# Patient Record
Sex: Male | Born: 1960 | Race: Black or African American | Hispanic: No | Marital: Married | Smoking: Never smoker
Health system: Southern US, Community
[De-identification: ages and names within clinical notes are randomized; demographics above are authoritative.]

## PROBLEM LIST (undated history)

## (undated) ENCOUNTER — Emergency Department (HOSPITAL_COMMUNITY): Admission: EM | Discharge status: Absent without leave | Payer: BC Managed Care – PPO

## (undated) DIAGNOSIS — Z9889 Other specified postprocedural states: Secondary | ICD-10-CM

## (undated) DIAGNOSIS — Z87820 Personal history of traumatic brain injury: Secondary | ICD-10-CM

## (undated) DIAGNOSIS — E291 Testicular hypofunction: Secondary | ICD-10-CM

## (undated) DIAGNOSIS — E538 Deficiency of other specified B group vitamins: Secondary | ICD-10-CM

## (undated) DIAGNOSIS — C61 Malignant neoplasm of prostate: Secondary | ICD-10-CM

## (undated) DIAGNOSIS — R972 Elevated prostate specific antigen [PSA]: Secondary | ICD-10-CM

## (undated) DIAGNOSIS — G609 Hereditary and idiopathic neuropathy, unspecified: Secondary | ICD-10-CM

## (undated) DIAGNOSIS — Z9189 Other specified personal risk factors, not elsewhere classified: Secondary | ICD-10-CM

## (undated) DIAGNOSIS — R112 Nausea with vomiting, unspecified: Secondary | ICD-10-CM

## (undated) DIAGNOSIS — E559 Vitamin D deficiency, unspecified: Secondary | ICD-10-CM

## (undated) DIAGNOSIS — E785 Hyperlipidemia, unspecified: Secondary | ICD-10-CM

## (undated) HISTORY — PX: PROSTATE BIOPSY: SHX241

## (undated) HISTORY — PX: LOWER LEG SOFT TISSUE TUMOR EXCISION: SUR553

## (undated) HISTORY — PX: SKIN LESION EXCISION: SHX2412

## (undated) HISTORY — PX: COLONOSCOPY WITH PROPOFOL: SHX5780

## (undated) HISTORY — DX: Hyperlipidemia, unspecified: E78.5

---

## 2012-05-28 HISTORY — PX: COLONOSCOPY WITH PROPOFOL: SHX5780

## 2013-02-19 ENCOUNTER — Ambulatory Visit (INDEPENDENT_AMBULATORY_CARE_PROVIDER_SITE_OTHER): Payer: BC Managed Care – PPO

## 2013-02-19 ENCOUNTER — Encounter: Payer: Self-pay | Admitting: Family Medicine

## 2013-02-19 ENCOUNTER — Ambulatory Visit (INDEPENDENT_AMBULATORY_CARE_PROVIDER_SITE_OTHER): Payer: BC Managed Care – PPO | Admitting: Family Medicine

## 2013-02-19 VITALS — BP 138/77 | HR 87 | Temp 98.6°F | Ht 72.0 in | Wt 310.0 lb

## 2013-02-19 DIAGNOSIS — R05 Cough: Secondary | ICD-10-CM

## 2013-02-19 DIAGNOSIS — R5381 Other malaise: Secondary | ICD-10-CM

## 2013-02-19 DIAGNOSIS — G2581 Restless legs syndrome: Secondary | ICD-10-CM

## 2013-02-19 DIAGNOSIS — Z0000 Encounter for general adult medical examination without abnormal findings: Secondary | ICD-10-CM

## 2013-02-19 DIAGNOSIS — R109 Unspecified abdominal pain: Secondary | ICD-10-CM

## 2013-02-19 DIAGNOSIS — R0609 Other forms of dyspnea: Secondary | ICD-10-CM

## 2013-02-19 DIAGNOSIS — R0683 Snoring: Secondary | ICD-10-CM

## 2013-02-19 DIAGNOSIS — R809 Proteinuria, unspecified: Secondary | ICD-10-CM

## 2013-02-19 LAB — POCT URINALYSIS DIPSTICK
Bilirubin, UA: 1.025
Glucose, UA: 6
Ketones, UA: NEGATIVE
Leukocytes, UA: NEGATIVE
Nitrite, UA: NEGATIVE
Spec Grav, UA: 1.025
Urobilinogen, UA: NEGATIVE
pH, UA: 6

## 2013-02-19 LAB — POCT UA - MICROSCOPIC ONLY
Crystals, Ur, HPF, POC: NEGATIVE
Epithelial cells, urine per micros: NEGATIVE
Yeast, UA: NEGATIVE

## 2013-02-19 MED ORDER — CARBIDOPA-LEVODOPA ER 50-200 MG PO TBCR: 1.0000 | EXTENDED_RELEASE_TABLET | Freq: Two times a day (BID) | ORAL | Status: DC

## 2013-02-19 NOTE — Patient Instructions (Signed)

## 2013-02-19 NOTE — Progress Notes (Signed)
Subjective:    Patient ID: Scott David, male    DOB: Sep 27, 1960, 52 y.o.   MRN: [id number]  HPI This 52 y.o. male presents for evaluation of cpe.  He is having some fatigue.  He is accompanied By his wife.  He has been having somnolence.  He is working 10 - 14 hour days.  He has not had a CPE in 2 years.  He has hx of obesity.  He has lower abdominal pain in his groin area.  He has family hx of cancer.  He is concerned about having cancer.  His brother has hx of prostate cancer.  His Sister has hx of colon cancer.  His father had kidney cancer but it started in his prostate.  He  Has snoring and his wife states he quits breathing at night. He c/o cough   Review of Systems C/o insomnia, fatigue, abdominal pain, cough,and somnolence.    No chest pain, SOB, HA, dizziness, vision change, N/V, diarrhea, constipation, dysuria, urinary urgency or frequency, myalgias, arthralgias or rash.  Objective:   Physical Exam Vital signs noted  Well developed well nourished obese AA male.  HEENT - Head atraumatic Normocephalic                Eyes - PERRLA, Conjuctiva - clear Sclera- Clear EOMI                Ears - EAC's Wnl TM's Wnl Gross Hearing WNL                Nose - Nares patent                 Throat - oropharanx wnl Respiratory - Lungs CTA bilateral Cardiac - RRR S1 and S2 without murmur GI - Abdomen soft Nontender and bowel sounds active x 4 Rectal - Deferred Extremities - No edema. Neuro - Grossly intact.  Results for orders placed in visit on 02/19/13  POCT UA - MICROSCOPIC ONLY      Result Value Range   WBC, Ur, HPF, POC occ     RBC, urine, microscopic 3-10     Bacteria, U Microscopic few     Mucus, UA mod     Epithelial cells, urine per micros neg     Crystals, Ur, HPF, POC neg     Casts, Ur, LPF, POC occ     Yeast, UA neg    POCT URINALYSIS DIPSTICK      Result Value Range   Color, UA amber     Clarity, UA clear     Glucose, UA 6.0     Bilirubin, UA 1.025     Ketones,  UA negative     Spec Grav, UA 1.025     Blood, UA moderate     pH, UA 6.0     Protein, UA 4+     Urobilinogen, UA negative     Nitrite, UA negative     Leukocytes, UA Negative        EKG - NSR without acute ST-T changes.  CXR - Normal  Prelimnary reading by Scott Noa Tilla Wilborn,FNP  KUB - Some stool in ascending colon but no acute disease otherwise normal Prelimnary reading by Scott Noa Eward Rutigliano,FNP Assessment & Plan:  Routine general medical examination at a health care facility - Plan: POCT CBC, Lipid panel, CMP14+EGFR, Testosterone,Free and Total, Thyroid Panel With TSH, Vitamin B12, Vit D  25 hydroxy (rtn osteoporosis monitoring), POCT UA - Microscopic Only, POCT urinalysis dipstick  Other malaise and fatigue - Plan: POCT CBC, CMP14+EGFR, Vitamin B12, Vit D  25 hydroxy (rtn osteoporosis monitoring), EKG 12-.Lead  Snoring - Discussed with patient he should get sleep study and he wants to wait.  Obesity - Discussed doing aggressive weight loss measures and start exercising.  RLS (restless legs syndrome) - Plan: carbidopa-levodopa (SINEMET CR) 50-200 MG per tablet  Abdominal pain, unspecified site - Plan: DG Abd 1 View  Cough - Plan: DG Chest 2 View  Proteinuria - Plan: Protein, urine, 24 hour, Creatinine, Urine, 24 Hour.  Follow up next week to discuss lab results.  Deatra Canter FNP

## 2013-02-20 ENCOUNTER — Other Ambulatory Visit (INDEPENDENT_AMBULATORY_CARE_PROVIDER_SITE_OTHER): Payer: BC Managed Care – PPO

## 2013-02-20 DIAGNOSIS — R5381 Other malaise: Secondary | ICD-10-CM

## 2013-02-20 DIAGNOSIS — Z0000 Encounter for general adult medical examination without abnormal findings: Secondary | ICD-10-CM

## 2013-02-20 LAB — POCT CBC
Granulocyte percent: 64 %G (ref 37–80)
HCT, POC: 39.5 % — AB (ref 43.5–53.7)
Hemoglobin: 12.8 g/dL — AB (ref 14.1–18.1)
Lymph, poc: 2.5 (ref 0.6–3.4)
MCH, POC: 27.4 pg (ref 27–31.2)
MCHC: 32.5 g/dL (ref 31.8–35.4)
MCV: 84.4 fL (ref 80–97)
MPV: 6.7 fL (ref 0–99.8)
POC Granulocyte: 5.1 (ref 2–6.9)
POC LYMPH PERCENT: 31.4 %L (ref 10–50)
Platelet Count, POC: 400 10*3/uL (ref 142–424)
RBC: 4.7 M/uL (ref 4.69–6.13)
RDW, POC: 12.7 %
WBC: 8 10*3/uL (ref 4.6–10.2)

## 2013-02-20 NOTE — Progress Notes (Signed)
Patient came in for labs only.

## 2013-02-21 LAB — CMP14+EGFR
ALT: 19 IU/L (ref 0–44)
AST: 20 IU/L (ref 0–40)
Albumin/Globulin Ratio: 1.9 (ref 1.1–2.5)
Albumin: 4.5 g/dL (ref 3.5–5.5)
Alkaline Phosphatase: 57 IU/L (ref 39–117)
BUN/Creatinine Ratio: 12 (ref 9–20)
BUN: 14 mg/dL (ref 6–24)
CO2: 25 mmol/L (ref 18–29)
Calcium: 10 mg/dL (ref 8.7–10.2)
Chloride: 101 mmol/L (ref 97–108)
Creatinine, Ser: 1.19 mg/dL (ref 0.76–1.27)
GFR calc Af Amer: 81 mL/min/{1.73_m2} (ref >59)
GFR calc non Af Amer: 70 mL/min/{1.73_m2} (ref >59)
Globulin, Total: 2.4 g/dL (ref 1.5–4.5)
Glucose: 82 mg/dL (ref 65–99)
Potassium: 4.7 mmol/L (ref 3.5–5.2)
Sodium: 141 mmol/L (ref 134–144)
Total Bilirubin: 0.4 mg/dL (ref 0.0–1.2)
Total Protein: 6.9 g/dL (ref 6.0–8.5)

## 2013-02-21 LAB — LIPID PANEL
Chol/HDL Ratio: 5.1 ratio units — ABNORMAL HIGH (ref 0.0–5.0)
Cholesterol, Total: 198 mg/dL (ref 100–199)
HDL: 39 mg/dL — ABNORMAL LOW (ref >39)
LDL Calculated: 118 mg/dL — ABNORMAL HIGH (ref 0–99)
Triglycerides: 203 mg/dL — ABNORMAL HIGH (ref 0–149)
VLDL Cholesterol Cal: 41 mg/dL — ABNORMAL HIGH (ref 5–40)

## 2013-02-21 LAB — THYROID PANEL WITH TSH
Free Thyroxine Index: 3 (ref 1.2–4.9)
T3 Uptake Ratio: 32 % (ref 24–39)
T4, Total: 9.3 ug/dL (ref 4.5–12.0)
TSH: 3.79 u[IU]/mL (ref 0.450–4.500)

## 2013-02-21 LAB — TESTOSTERONE,FREE AND TOTAL
Testosterone, Free: 4.9 pg/mL — ABNORMAL LOW (ref 7.2–24.0)
Testosterone: 149 ng/dL — ABNORMAL LOW (ref 348–1197)

## 2013-02-21 LAB — VITAMIN B12: Vitamin B-12: 289 pg/mL (ref 211–946)

## 2013-02-21 LAB — VITAMIN D 25 HYDROXY (VIT D DEFICIENCY, FRACTURES): Vit D, 25-Hydroxy: 22.1 ng/mL — ABNORMAL LOW (ref 30.0–100.0)

## 2013-02-23 ENCOUNTER — Other Ambulatory Visit: Payer: BC Managed Care – PPO

## 2013-02-23 NOTE — Addendum Note (Signed)
Addended by: Tommas Olp on: 02/23/2013 11:48 AM   Modules accepted: Orders

## 2013-02-24 LAB — PROTEIN, URINE, 24 HOUR
Protein, 24H Urine: 92 mg/24 hr (ref 30.0–150.0)
Protein, Ur: 9.2 mg/dL (ref 0.0–15.0)

## 2013-02-24 LAB — CREATININE, URINE, 24 HOUR
Creatinine, 24H Ur: 2492 mg/24 hr — ABNORMAL HIGH (ref 1000.0–2000.0)
Creatinine, Ur: 249.2 mg/dL (ref 22.0–328.0)

## 2013-02-27 ENCOUNTER — Other Ambulatory Visit: Payer: Self-pay | Admitting: Family Medicine

## 2013-02-27 LAB — SPECIMEN STATUS REPORT

## 2013-02-27 LAB — PSA: PSA: 2.7 ng/mL (ref 0.0–4.0)

## 2013-03-04 ENCOUNTER — Encounter: Payer: Self-pay | Admitting: Family Medicine

## 2013-03-04 ENCOUNTER — Ambulatory Visit (INDEPENDENT_AMBULATORY_CARE_PROVIDER_SITE_OTHER): Payer: BC Managed Care – PPO | Admitting: Family Medicine

## 2013-03-04 VITALS — BP 125/71 | HR 87 | Temp 97.8°F | Wt 308.4 lb

## 2013-03-04 DIAGNOSIS — R972 Elevated prostate specific antigen [PSA]: Secondary | ICD-10-CM

## 2013-03-04 DIAGNOSIS — R809 Proteinuria, unspecified: Secondary | ICD-10-CM

## 2013-03-04 DIAGNOSIS — E559 Vitamin D deficiency, unspecified: Secondary | ICD-10-CM

## 2013-03-04 DIAGNOSIS — E538 Deficiency of other specified B group vitamins: Secondary | ICD-10-CM

## 2013-03-04 MED ORDER — CYANOCOBALAMIN 1000 MCG/ML IJ SOLN: INTRAMUSCULAR | Status: DC

## 2013-03-04 MED ORDER — VITAMIN D (ERGOCALCIFEROL) 1.25 MG (50000 UNIT) PO CAPS: 50000.0000 [IU] | ORAL_CAPSULE | ORAL | Status: DC

## 2013-03-04 MED ORDER — LISINOPRIL 5 MG PO TABS: 5.0000 mg | ORAL_TABLET | Freq: Every day | ORAL | Status: DC

## 2013-03-04 NOTE — Progress Notes (Signed)
  Subjective:    Patient ID: Scott David, male    DOB: [birth year], 52 y.o.   MRN: [id number]  HPI This 52 y.o. male presents for evaluation of follow up. He has been experiencing fatigue. He has had labs which show low normal vitamin B12.  He has elevated PSA.  He has  Family hx of prostate cancer.  He has low vitamin D.  He has protienuria.  His 24 hour UA shows normal range protienuria and elevated urine creatinine.  His serum creatinine And GFR are wnl.  .   Review of Systems    No chest pain, SOB, HA, dizziness, vision change, N/V, diarrhea, constipation, dysuria, urinary urgency or frequency, myalgias, arthralgias or rash.  Objective:   Physical Exam  Vital signs noted  Well developed well nourished male.  HEENT - Head atraumatic Normocephalic                Eyes - PERRLA, Conjuctiva - clear Sclera- Clear EOMI                Ears - EAC's Wnl TM's Wnl Gross Hearing WNL                Nose - Nares patent                 Throat - oropharanx wnl Respiratory - Lungs CTA bilateral Cardiac - RRR S1 and S2 without murmur GI - Abdomen soft Nontender and bowel sounds active x 4 Rectal - deferred. Extremities - No edema. Neuro - Grossly intact.      Assessment & Plan:  Elevated PSA - Plan: Ambulatory referral to Urology  B12 deficiency - Plan: cyanocobalamin (,VITAMIN B-12,) 1000 MCG/ML injection  Unspecified vitamin D deficiency - Plan: Vitamin D, Ergocalciferol, (DRISDOL) 50000 UNITS CAPS capsule  Proteinuria - Plan: lisinopril (PRINIVIL,ZESTRIL) 5 MG tablet and repeat bmp in 2-4 weeks.  Repeat urine in 3 months at follow up.  Follow up in 3 months  Deatra Canter FNP

## 2013-03-05 NOTE — Patient Instructions (Signed)
Calorie Counting Diet A calorie counting diet requires you to eat the number of calories that are right for you in a day. Calories are the measurement of how much energy you get from the food you eat. Eating the right amount of calories is important for staying at a healthy weight. If you eat too many calories, your body will store them as fat and you may gain weight. If you eat too few calories, you may lose weight. Counting the number of calories you eat during a day will help you know if you are eating the right amount. A Registered Dietitian can determine how many calories you need in a day. The amount of calories needed varies from person to person. If your goal is to lose weight, you will need to eat fewer calories. Losing weight can benefit you if you are overweight or have health problems such as heart disease, high blood pressure, or diabetes. If your goal is to gain weight, you will need to eat more calories. Gaining weight may be necessary if you have a certain health problem that causes your body to need more energy. TIPS Whether you are increasing or decreasing the number of calories you eat during a day, it may be hard to get used to changes in what you eat and drink. The following are tips to help you keep track of the number of calories you eat.  Measure foods at home with measuring cups. This helps you know the amount of food and number of calories you are eating.  Restaurants often serve food in amounts that are larger than 1 serving. While eating out, estimate how many servings of a food you are given. For example, a serving of cooked rice is  cup or about the size of half of a fist. Knowing serving sizes will help you be aware of how much food you are eating at restaurants.  Ask for smaller portion sizes or child-size portions at restaurants.  Plan to eat half of a meal at a restaurant. Take the rest home or share the other half with a friend.  Read the Nutrition Facts panel on  food labels for calorie content and serving size. You can find out how many servings are in a package, the size of a serving, and the number of calories each serving has.  For example, a package might contain 3 cookies. The Nutrition Facts panel on that package says that 1 serving is 1 cookie. Below that, it will say there are 3 servings in the container. The calories section of the Nutrition Facts label says there are 90 calories. This means there are 90 calories in 1 cookie (1 serving). If you eat 1 cookie you have eaten 90 calories. If you eat all 3 cookies, you have eaten 270 calories (3 servings x 90 calories = 270 calories). The list below tells you how big or small some common portion sizes are.  1 oz.........4 stacked dice.  3 oz.........Deck of cards.  1 tsp........Tip of little finger.  1 tbs........Thumb.  2 tbs........Golf ball.   cup.......Half of a fist.  1 cup........A fist. KEEP A FOOD LOG Write down every food item you eat, the amount you eat, and the number of calories in each food you eat during the day. At the end of the day, you can add up the total number of calories you have eaten. It may help to keep a list like the one below. Find out the calorie information by reading the   Nutrition Facts panel on food labels. Breakfast  Bran cereal (1 cup, 110 calories).  Fat-free milk ( cup, 45 calories). Snack  Apple (1 medium, 80 calories). Lunch  Spinach (1 cup, 20 calories).  Tomato ( medium, 20 calories).  Chicken breast strips (3 oz, 165 calories).  Shredded cheddar cheese ( cup, 110 calories).  Light Italian dressing (2 tbs, 60 calories).  Whole-wheat bread (1 slice, 80 calories).  Tub margarine (1 tsp, 35 calories).  Vegetable soup (1 cup, 160 calories). Dinner  Pork chop (3 oz, 190 calories).  Brown rice (1 cup, 215 calories).  Steamed broccoli ( cup, 20 calories).  Strawberries (1  cup, 65 calories).  Whipped cream (1 tbs, 50  calories). Daily Calorie Total: 1425 Document Released: 05/14/2005 Document Revised: 08/06/2011 Document Reviewed: 11/08/2006 [employer] Patient Information 2014 [employer].  

## 2013-06-04 ENCOUNTER — Ambulatory Visit (INDEPENDENT_AMBULATORY_CARE_PROVIDER_SITE_OTHER): Payer: BC Managed Care – PPO | Admitting: Family Medicine

## 2013-06-04 VITALS — BP 132/77 | HR 73 | Temp 98.7°F | Ht 72.0 in | Wt 312.0 lb

## 2013-06-04 DIAGNOSIS — E559 Vitamin D deficiency, unspecified: Secondary | ICD-10-CM

## 2013-06-04 DIAGNOSIS — R809 Proteinuria, unspecified: Secondary | ICD-10-CM

## 2013-06-04 DIAGNOSIS — D649 Anemia, unspecified: Secondary | ICD-10-CM

## 2013-06-04 DIAGNOSIS — E538 Deficiency of other specified B group vitamins: Secondary | ICD-10-CM

## 2013-06-04 LAB — POCT UA - MICROALBUMIN: Microalbumin Ur, POC: 50 mg/L

## 2013-06-04 LAB — POCT CBC
Granulocyte percent: 65.9 %G (ref 37–80)
HCT, POC: 41.3 % — AB (ref 43.5–53.7)
Hemoglobin: 13 g/dL — AB (ref 14.1–18.1)
Lymph, poc: 2.6 (ref 0.6–3.4)
MCH, POC: 26.9 pg — AB (ref 27–31.2)
MCHC: 31.6 g/dL — AB (ref 31.8–35.4)
MCV: 85.2 fL (ref 80–97)
MPV: 6.9 fL (ref 0–99.8)
POC Granulocyte: 5.9 (ref 2–6.9)
POC LYMPH PERCENT: 29.7 %L (ref 10–50)
Platelet Count, POC: 348 10*3/uL (ref 142–424)
RBC: 4.8 M/uL (ref 4.69–6.13)
RDW, POC: 12.3 %
WBC: 8.9 10*3/uL (ref 4.6–10.2)

## 2013-06-04 MED ORDER — CYANOCOBALAMIN 1000 MCG/ML IJ SOLN: INTRAMUSCULAR | Status: DC

## 2013-06-04 NOTE — Progress Notes (Signed)
   Subjective:    Patient ID: Scott David, male    DOB: 14-Jul-1960, 53 y.o.   MRN: [id number]  HPI  This 53 y.o. male presents for evaluation of follow up from last appointment and on labs.  He has protienuria and was started on lisinopril.  He has no diabetes.  He has anemia.  He has low b12. He has elevated PSA and has been referred to urology where he is pending a biopsy.  He is hypogonadal.  He has been getting b12 shots by his wife monthly.  He also had low vitamin D.  Review of Systems No chest pain, SOB, HA, dizziness, vision change, N/V, diarrhea, constipation, dysuria, urinary urgency or frequency, myalgias, arthralgias or rash.     Objective:   Physical Exam  Vital signs noted  Well developed well nourished male.  HEENT - Head atraumatic Normocephalic                Eyes - PERRLA, Conjuctiva - clear Sclera- Clear EOMI                Ears - EAC's Wnl TM's Wnl Gross Hearing WNL                Nose - Nares patent                 Throat - oropharanx wnl Respiratory - Lungs CTA bilateral Cardiac - RRR S1 and S2 without murmur GI - Abdomen soft Nontender and bowel sounds active x 4 Extremities - No edema. Neuro - Grossly intact.      Assessment & Plan:  B12 deficiency - Plan: cyanocobalamin (,VITAMIN B-12,) 1000 MCG/ML injection  Anemia - Plan: POCT CBC, Iron  Unspecified vitamin D deficiency - Plan: Vit D  25 hydroxy (rtn osteoporosis monitoring)  Proteinuria - Plan: POCT UA - Microalbumin, Microalbumin, urine  Hypogonadism - Wait until done with urology and when clear can start replacement.  Elevated PSA - Follow up with Urology.  Follow up in 3 - 4 months.  Lysbeth Penner FNP

## 2013-06-04 NOTE — Patient Instructions (Signed)

## 2013-06-05 LAB — VITAMIN D 25 HYDROXY (VIT D DEFICIENCY, FRACTURES): Vit D, 25-Hydroxy: 20.4 ng/mL — ABNORMAL LOW (ref 30.0–100.0)

## 2013-06-05 LAB — MICROALBUMIN, URINE: Microalbumin, Urine: 70.4 ug/mL — ABNORMAL HIGH (ref 0.0–17.0)

## 2013-06-05 LAB — IRON: Iron: 77 ug/dL (ref 40–155)

## 2013-06-09 ENCOUNTER — Other Ambulatory Visit: Payer: Self-pay | Admitting: Family Medicine

## 2013-06-09 DIAGNOSIS — E559 Vitamin D deficiency, unspecified: Secondary | ICD-10-CM

## 2013-06-09 MED ORDER — VITAMIN D (ERGOCALCIFEROL) 1.25 MG (50000 UNIT) PO CAPS: 50000.0000 [IU] | ORAL_CAPSULE | ORAL | Status: DC

## 2013-06-09 MED ORDER — LISINOPRIL 10 MG PO TABS: 10.0000 mg | ORAL_TABLET | Freq: Every day | ORAL | Status: DC

## 2013-06-25 ENCOUNTER — Other Ambulatory Visit: Payer: Self-pay | Admitting: Urology

## 2013-07-20 ENCOUNTER — Encounter (HOSPITAL_BASED_OUTPATIENT_CLINIC_OR_DEPARTMENT_OTHER): Payer: Self-pay | Admitting: *Deleted

## 2013-08-10 ENCOUNTER — Encounter (HOSPITAL_BASED_OUTPATIENT_CLINIC_OR_DEPARTMENT_OTHER): Payer: Self-pay | Admitting: *Deleted

## 2013-08-10 NOTE — Progress Notes (Signed)
SPOKE W/ WIFE. NPO AFTER MN. ARRIVE AT 0700. NEEDS HG. WILL DO FLEET ENEMA AM DOS.

## 2013-08-10 NOTE — Progress Notes (Signed)
08/10/13 1201  OBSTRUCTIVE SLEEP APNEA  Have you ever been diagnosed with sleep apnea through a sleep study? No  Do you snore loudly (loud enough to be heard through closed doors)?  0  Do you often feel tired, fatigued, or sleepy during the daytime? 0  Has anyone observed you stop breathing during your sleep? 0  Do you have, or are you being treated for high blood pressure? 0  BMI more than 35 kg/m2? 1  Age over 52 years old? 1  Neck circumference greater than 40 cm/18 inches? 1  Gender: 1  Obstructive Sleep Apnea Score 4  Score 4 or greater  Results sent to PCP

## 2013-08-11 ENCOUNTER — Other Ambulatory Visit: Payer: Self-pay | Admitting: *Deleted

## 2013-08-11 DIAGNOSIS — G4733 Obstructive sleep apnea (adult) (pediatric): Secondary | ICD-10-CM

## 2013-08-14 ENCOUNTER — Ambulatory Visit (HOSPITAL_BASED_OUTPATIENT_CLINIC_OR_DEPARTMENT_OTHER): Admission: RE | Admit: 2013-08-14 | Payer: BC Managed Care – PPO | Source: Ambulatory Visit | Admitting: Urology

## 2013-08-14 ENCOUNTER — Encounter (HOSPITAL_BASED_OUTPATIENT_CLINIC_OR_DEPARTMENT_OTHER): Payer: Self-pay | Admitting: Anesthesiology

## 2013-08-14 HISTORY — DX: Nausea with vomiting, unspecified: Z98.890

## 2013-08-14 HISTORY — DX: Personal history of traumatic brain injury: Z87.820

## 2013-08-14 HISTORY — DX: Other specified personal risk factors, not elsewhere classified: Z91.89

## 2013-08-14 HISTORY — DX: Elevated prostate specific antigen (PSA): R97.20

## 2013-08-14 HISTORY — DX: Vitamin D deficiency, unspecified: E55.9

## 2013-08-14 HISTORY — DX: Testicular hypofunction: E29.1

## 2013-08-14 HISTORY — DX: Deficiency of other specified B group vitamins: E53.8

## 2013-08-14 HISTORY — DX: Hereditary and idiopathic neuropathy, unspecified: G60.9

## 2013-08-14 HISTORY — DX: Nausea with vomiting, unspecified: R11.2

## 2013-08-14 SURGERY — BIOPSY, PROSTATE, RECTAL APPROACH, WITH US GUIDANCE
Anesthesia: General

## 2013-08-14 NOTE — Anesthesia Preprocedure Evaluation (Deleted)
Anesthesia Evaluation  Patient identified by MRN, date of birth, ID band Patient awake    Reviewed: Allergy & Precautions, H&P , NPO status , Patient's Chart, lab work & pertinent test results  Airway Mallampati: II TM Distance: <3 FB Neck ROM: Full    Dental no notable dental hx.    Pulmonary neg pulmonary ROS,  breath sounds clear to auscultation  Pulmonary exam normal + decreased breath sounds      Cardiovascular negative cardio ROS  Rhythm:Regular Rate:Normal     Neuro/Psych negative neurological ROS  negative psych ROS   GI/Hepatic negative GI ROS, Neg liver ROS,   Endo/Other  Morbid obesity  Renal/GU negative Renal ROS  negative genitourinary   Musculoskeletal negative musculoskeletal ROS (+)   Abdominal   Peds negative pediatric ROS (+)  Hematology negative hematology ROS (+)   Anesthesia Other Findings   Reproductive/Obstetrics negative OB ROS                           Anesthesia Physical Anesthesia Plan  ASA: III  Anesthesia Plan: MAC   Post-op Pain Management:    Induction: Intravenous  Airway Management Planned: Simple Face Mask  Additional Equipment:   Intra-op Plan:   Post-operative Plan:   Informed Consent: I have reviewed the patients History and Physical, chart, labs and discussed the procedure including the risks, benefits and alternatives for the proposed anesthesia with the patient or authorized representative who has indicated his/her understanding and acceptance.   Dental advisory given  Plan Discussed with: CRNA and Surgeon  Anesthesia Plan Comments:         Anesthesia Quick Evaluation

## 2013-10-02 ENCOUNTER — Encounter: Payer: Self-pay | Admitting: Family Medicine

## 2013-10-02 ENCOUNTER — Ambulatory Visit (INDEPENDENT_AMBULATORY_CARE_PROVIDER_SITE_OTHER): Payer: BC Managed Care – PPO | Admitting: Family Medicine

## 2013-10-02 VITALS — BP 128/65 | HR 85 | Temp 98.3°F | Ht 72.0 in | Wt 316.8 lb

## 2013-10-02 DIAGNOSIS — E559 Vitamin D deficiency, unspecified: Secondary | ICD-10-CM

## 2013-10-02 DIAGNOSIS — R5381 Other malaise: Secondary | ICD-10-CM

## 2013-10-02 DIAGNOSIS — E538 Deficiency of other specified B group vitamins: Secondary | ICD-10-CM

## 2013-10-02 DIAGNOSIS — R809 Proteinuria, unspecified: Secondary | ICD-10-CM

## 2013-10-02 DIAGNOSIS — R5383 Other fatigue: Secondary | ICD-10-CM

## 2013-10-02 LAB — POCT CBC
Granulocyte percent: 63 %G (ref 37–80)
HCT, POC: 38 % — AB (ref 43.5–53.7)
Hemoglobin: 12.2 g/dL — AB (ref 14.1–18.1)
Lymph, poc: 2.3 (ref 0.6–3.4)
MCH, POC: 27.2 pg (ref 27–31.2)
MCHC: 32.1 g/dL (ref 31.8–35.4)
MCV: 84.9 fL (ref 80–97)
MPV: 6.8 fL (ref 0–99.8)
POC Granulocyte: 4.4 (ref 2–6.9)
POC LYMPH PERCENT: 33.1 %L (ref 10–50)
Platelet Count, POC: 358 10*3/uL (ref 142–424)
RBC: 4.5 M/uL — AB (ref 4.69–6.13)
RDW, POC: 12.4 %
WBC: 7 10*3/uL (ref 4.6–10.2)

## 2013-10-02 LAB — POCT UA - MICROALBUMIN: Microalbumin Ur, POC: NEGATIVE mg/L

## 2013-10-02 NOTE — Progress Notes (Signed)
   Subjective:    Patient ID: Scott David, male    DOB: May 30, 1960, 53 y.o.   MRN: [id number]  HPI This 53 y.o. male presents for evaluation of vit d def, b12 def, proteinuria.   Review of Systems No chest pain, SOB, HA, dizziness, vision change, N/V, diarrhea, constipation, dysuria, urinary urgency or frequency, myalgias, arthralgias or rash.     Objective:   Physical Exam Vital signs noted  Well developed well nourished male.  HEENT - Head atraumatic Normocephalic                Eyes - PERRLA, Conjuctiva - clear Sclera- Clear EOMI                Ears - EAC's Wnl TM's Wnl Gross Hearing WNL                Nose - Nares patent                 Throat - oropharanx wnl Respiratory - Lungs CTA bilateral Cardiac - RRR S1 and S2 without murmur GI - Abdomen soft Nontender and bowel sounds active x 4 Extremities - No edema. Neuro - Grossly intact.       Assessment & Plan:  B12 deficiency - Plan: Vitamin B12  Unspecified vitamin D deficiency - Plan: Vit D  25 hydroxy (rtn osteoporosis monitoring)  Proteinuria - Plan: POCT UA - Microalbumin  Other malaise and fatigue - Plan: CMP14+EGFR, POCT CBC  Lysbeth Penner FNP

## 2013-10-03 LAB — CMP14+EGFR
ALT: 18 IU/L (ref 0–44)
AST: 16 IU/L (ref 0–40)
Albumin/Globulin Ratio: 1.8 (ref 1.1–2.5)
Albumin: 4.4 g/dL (ref 3.5–5.5)
Alkaline Phosphatase: 61 IU/L (ref 39–117)
BUN/Creatinine Ratio: 14 (ref 9–20)
BUN: 15 mg/dL (ref 6–24)
CO2: 25 mmol/L (ref 18–29)
Calcium: 9.1 mg/dL (ref 8.7–10.2)
Chloride: 101 mmol/L (ref 97–108)
Creatinine, Ser: 1.07 mg/dL (ref 0.76–1.27)
GFR calc Af Amer: 92 mL/min/{1.73_m2} (ref >59)
GFR calc non Af Amer: 80 mL/min/{1.73_m2} (ref >59)
Globulin, Total: 2.4 g/dL (ref 1.5–4.5)
Glucose: 129 mg/dL — ABNORMAL HIGH (ref 65–99)
Potassium: 3.8 mmol/L (ref 3.5–5.2)
Sodium: 140 mmol/L (ref 134–144)
Total Bilirubin: 0.3 mg/dL (ref 0.0–1.2)
Total Protein: 6.8 g/dL (ref 6.0–8.5)

## 2013-10-03 LAB — VITAMIN D 25 HYDROXY (VIT D DEFICIENCY, FRACTURES): Vit D, 25-Hydroxy: 13.6 ng/mL — ABNORMAL LOW (ref 30.0–100.0)

## 2013-10-03 LAB — VITAMIN B12: Vitamin B-12: 786 pg/mL (ref 211–946)

## 2013-10-06 ENCOUNTER — Other Ambulatory Visit: Payer: Self-pay | Admitting: Family Medicine

## 2013-10-06 DIAGNOSIS — E559 Vitamin D deficiency, unspecified: Secondary | ICD-10-CM

## 2013-10-06 MED ORDER — VITAMIN D (ERGOCALCIFEROL) 1.25 MG (50000 UNIT) PO CAPS: 50000.0000 [IU] | ORAL_CAPSULE | ORAL | Status: DC

## 2013-12-11 ENCOUNTER — Other Ambulatory Visit: Payer: Self-pay | Admitting: Family Medicine

## 2014-08-27 ENCOUNTER — Ambulatory Visit: Payer: BC Managed Care – PPO | Admitting: Family Medicine

## 2014-09-08 ENCOUNTER — Ambulatory Visit: Payer: BC Managed Care – PPO | Admitting: Family Medicine

## 2014-09-10 ENCOUNTER — Other Ambulatory Visit: Payer: Self-pay | Admitting: *Deleted

## 2014-09-10 DIAGNOSIS — R5383 Other fatigue: Secondary | ICD-10-CM

## 2014-09-10 DIAGNOSIS — R809 Proteinuria, unspecified: Secondary | ICD-10-CM

## 2014-09-10 DIAGNOSIS — E785 Hyperlipidemia, unspecified: Secondary | ICD-10-CM

## 2014-09-10 DIAGNOSIS — E538 Deficiency of other specified B group vitamins: Secondary | ICD-10-CM

## 2014-09-10 DIAGNOSIS — D519 Vitamin B12 deficiency anemia, unspecified: Secondary | ICD-10-CM

## 2014-09-10 DIAGNOSIS — R972 Elevated prostate specific antigen [PSA]: Secondary | ICD-10-CM

## 2014-09-13 ENCOUNTER — Ambulatory Visit (INDEPENDENT_AMBULATORY_CARE_PROVIDER_SITE_OTHER): Payer: BC Managed Care – PPO | Admitting: Family Medicine

## 2014-09-13 ENCOUNTER — Encounter: Payer: Self-pay | Admitting: Family Medicine

## 2014-09-13 VITALS — BP 125/80 | HR 90 | Temp 97.2°F | Ht 72.0 in | Wt 311.0 lb

## 2014-09-13 DIAGNOSIS — D519 Vitamin B12 deficiency anemia, unspecified: Secondary | ICD-10-CM

## 2014-09-13 DIAGNOSIS — E785 Hyperlipidemia, unspecified: Secondary | ICD-10-CM | POA: Diagnosis not present

## 2014-09-13 DIAGNOSIS — R5383 Other fatigue: Secondary | ICD-10-CM | POA: Diagnosis not present

## 2014-09-13 DIAGNOSIS — R972 Elevated prostate specific antigen [PSA]: Secondary | ICD-10-CM

## 2014-09-13 DIAGNOSIS — E538 Deficiency of other specified B group vitamins: Secondary | ICD-10-CM | POA: Diagnosis not present

## 2014-09-13 DIAGNOSIS — R809 Proteinuria, unspecified: Secondary | ICD-10-CM

## 2014-09-13 LAB — POCT CBC
Granulocyte percent: 79.4 %G (ref 37–80)
HCT, POC: 43.9 % (ref 43.5–53.7)
Hemoglobin: 13.4 g/dL — AB (ref 14.1–18.1)
LYMPH, POC: 2 (ref 0.6–3.4)
MCH, POC: 26 pg — AB (ref 27–31.2)
MCHC: 30.6 g/dL — AB (ref 31.8–35.4)
MCV: 85 fL (ref 80–97)
MPV: 7.2 fL (ref 0–99.8)
PLATELET COUNT, POC: 429 10*3/uL — AB (ref 142–424)
POC GRANULOCYTE: 8.3 — AB (ref 2–6.9)
POC LYMPH PERCENT: 18.8 %L (ref 10–50)
RBC: 5.16 M/uL (ref 4.69–6.13)
RDW, POC: 12.4 %
WBC: 10.4 10*3/uL — AB (ref 4.6–10.2)

## 2014-09-13 LAB — POCT UA - MICROSCOPIC ONLY
Bacteria, U Microscopic: NEGATIVE
CASTS, UR, LPF, POC: NEGATIVE
Crystals, Ur, HPF, POC: NEGATIVE
RBC, urine, microscopic: NEGATIVE
WBC, Ur, HPF, POC: NEGATIVE
Yeast, UA: NEGATIVE

## 2014-09-13 LAB — POCT URINALYSIS DIPSTICK
BILIRUBIN UA: NEGATIVE
Blood, UA: NEGATIVE
GLUCOSE UA: NEGATIVE
Ketones, UA: NEGATIVE
Leukocytes, UA: NEGATIVE
NITRITE UA: NEGATIVE
PH UA: 6
Spec Grav, UA: 1.02
Urobilinogen, UA: NEGATIVE

## 2014-09-13 MED ORDER — LORCASERIN HCL 10 MG PO TABS: 10.0000 mg | ORAL_TABLET | Freq: Two times a day (BID) | ORAL | Status: DC

## 2014-09-13 NOTE — Patient Instructions (Signed)

## 2014-09-13 NOTE — Progress Notes (Signed)
Subjective:    Patient ID: Scott David, male    DOB: 1961/02/18, 54 y.o.   MRN: [id number]  HPI Chief Complaint  Patient presents with  . Proteinuria    History of proteinuria last year  . Hyperlipidemia  . Medication Refill  Patient in for follow-up of long-term history of elevated cholesterol. Currently off medication. He has no  chest pain, shortness of breath or other cardiovascular related symptoms noted.  Denies symptoms of renal insufficiency including polyuria/oliguria and edema.  He has a history of B12 deficiency anemia and is in today for follow-up on his B12 level. His energy level is fair only. He has some concern for fatigue chronically. He has no peripheral numbness or paresthesia related to B12 deficiency.  Patient is interested in weight loss. He requests medication as well as dietary advice.  Patient Active Problem List   Diagnosis Date Noted  . Hyperlipidemia    Outpatient Encounter Prescriptions as of 09/13/2014  Medication Sig  . cyanocobalamin (,VITAMIN B-12,) 1000 MCG/ML injection USE 1 ML DAILY FOR 1 WEEK THEN EVERY WEEK FOR 4 WEEKS THEN MONTHLY  . MAGNESIUM PO Take 1 tablet by mouth as needed (PERIPHERAL NEUROPATHY).  . Multiple Vitamins-Minerals (MULTIVITAL) tablet Take 1 tablet by mouth daily.  Marland Kitchen POTASSIUM PO Take 1 tablet by mouth as needed (PERIPHARAL NEUROPATHY).  . Vitamin D, Ergocalciferol, (DRISDOL) 50000 UNITS CAPS capsule Take 1 capsule (50,000 Units total) by mouth every 7 (seven) days.  . Zinc 50 MG TABS Take 1 tablet by mouth daily.  -. [DISCONTINUED] cyanocobalamin (,VITAMIN B-12,) 1000 MCG/ML injection Inject into the muscle once a week.       Review of Systems  Constitutional: Negative for fever, chills, diaphoresis and unexpected weight change.  HENT: Negative for congestion, hearing loss, rhinorrhea, sore throat and trouble swallowing.   Respiratory: Negative for cough, chest tightness, shortness of breath and wheezing.     Gastrointestinal: Negative for nausea, vomiting, abdominal pain, diarrhea, constipation and abdominal distention.  Endocrine: Negative for cold intolerance and heat intolerance.  Genitourinary: Negative for dysuria, hematuria and flank pain.  Musculoskeletal: Negative for joint swelling and arthralgias.  Skin: Negative for rash.  Neurological: Negative for dizziness and headaches.  Psychiatric/Behavioral: Negative for dysphoric mood, decreased concentration and agitation. The patient is not nervous/anxious.        Objective:   Physical Exam  Constitutional: He is oriented to person, place, and time. He appears well-developed and well-nourished. No distress.  HENT:  Head: Normocephalic and atraumatic.  Right Ear: External ear normal.  Left Ear: External ear normal.  Nose: Nose normal.  Mouth/Throat: Oropharynx is clear and moist.  Eyes: Conjunctivae and EOM are normal. Pupils are equal, round, and reactive to light.  Neck: Normal range of motion. Neck supple. No thyromegaly present.  Cardiovascular: Normal rate, regular rhythm and normal heart sounds.   No murmur heard. Pulmonary/Chest: Effort normal and breath sounds normal. No respiratory distress. He has no wheezes. He has no rales.  Abdominal: Soft. Bowel sounds are normal. He exhibits no distension. There is no tenderness.  Lymphadenopathy:    He has no cervical adenopathy.  Neurological: He is alert and oriented to person, place, and time. He has normal reflexes.  Skin: Skin is warm and dry.  Psychiatric: He has a normal mood and affect. His behavior is normal. Judgment and thought content normal.    BP 125/80 mmHg  Pulse 90  Temp(Src) 97.2 F (36.2 C) (Oral)  Ht 6' (1.829  m)  Wt 311 lb (141.069 kg)  BMI 42.17 kg/m2       Assessment & Plan:   1. Proteinuria   2. Anemia due to vitamin B12 deficiency   3. B12 deficiency   4. Hyperlipemia   5. Elevated PSA   6. Other fatigue     Meds ordered this encounter   Medications  . Lorcaserin HCl (BELVIQ) 10 MG TABS    Sig: Take 10 mg by mouth 2 (two) times daily.    Dispense:  60 tablet    Refill:  2  . atorvastatin (LIPITOR) 40 MG tablet    Sig: Take 1 tablet (40 mg total) by mouth daily.    Dispense:  90 tablet    Refill:  3    Orders Placed This Encounter  Procedures  . POCT UA - Microscopic Only  . POCT urinalysis dipstick    Labs pending Health Maintenance reviewed Diet and exercise encouraged. Calorie counting for weight loss handout given to the patient Continue all meds as discussed Follow up in 3 months  Claretta Fraise, MD

## 2014-09-14 LAB — NMR, LIPOPROFILE
CHOLESTEROL: 228 mg/dL — AB (ref 100–199)
HDL Cholesterol by NMR: 46 mg/dL (ref >39)
HDL Particle Number: 31.9 umol/L (ref >=30.5)
LDL Particle Number: 1648 nmol/L — ABNORMAL HIGH (ref <1000)
LDL SIZE: 20.3 nm (ref >20.5)
LDL-C: 133 mg/dL — ABNORMAL HIGH (ref 0–99)
LP-IR Score: 81 — ABNORMAL HIGH (ref <=45)
Small LDL Particle Number: 947 nmol/L — ABNORMAL HIGH (ref <=527)
Triglycerides by NMR: 244 mg/dL — ABNORMAL HIGH (ref 0–149)

## 2014-09-14 LAB — CMP14+EGFR
ALBUMIN: 4.9 g/dL (ref 3.5–5.5)
ALK PHOS: 65 IU/L (ref 39–117)
ALT: 19 IU/L (ref 0–44)
AST: 23 IU/L (ref 0–40)
Albumin/Globulin Ratio: 2 (ref 1.1–2.5)
BILIRUBIN TOTAL: 0.5 mg/dL (ref 0.0–1.2)
BUN/Creatinine Ratio: 11 (ref 9–20)
BUN: 14 mg/dL (ref 6–24)
CHLORIDE: 99 mmol/L (ref 97–108)
CO2: 22 mmol/L (ref 18–29)
Calcium: 9.9 mg/dL (ref 8.7–10.2)
Creatinine, Ser: 1.24 mg/dL (ref 0.76–1.27)
GFR calc non Af Amer: 66 mL/min/{1.73_m2} (ref >59)
GFR, EST AFRICAN AMERICAN: 77 mL/min/{1.73_m2} (ref >59)
GLOBULIN, TOTAL: 2.5 g/dL (ref 1.5–4.5)
GLUCOSE: 79 mg/dL (ref 65–99)
Potassium: 4.3 mmol/L (ref 3.5–5.2)
SODIUM: 140 mmol/L (ref 134–144)
Total Protein: 7.4 g/dL (ref 6.0–8.5)

## 2014-09-14 LAB — LIPID PANEL
CHOL/HDL RATIO: 5 ratio (ref 0.0–5.0)
Cholesterol, Total: 226 mg/dL — ABNORMAL HIGH (ref 100–199)
HDL: 45 mg/dL (ref >39)
LDL Calculated: 134 mg/dL — ABNORMAL HIGH (ref 0–99)
Triglycerides: 234 mg/dL — ABNORMAL HIGH (ref 0–149)
VLDL Cholesterol Cal: 47 mg/dL — ABNORMAL HIGH (ref 5–40)

## 2014-09-14 LAB — PSA, TOTAL AND FREE
PSA FREE PCT: 13.1 %
PSA, Free: 0.51 ng/mL
PSA: 3.9 ng/mL (ref 0.0–4.0)

## 2014-09-14 LAB — THYROID PANEL WITH TSH
Free Thyroxine Index: 2.6 (ref 1.2–4.9)
T3 Uptake Ratio: 31 % (ref 24–39)
T4, Total: 8.3 ug/dL (ref 4.5–12.0)
TSH: 2.71 u[IU]/mL (ref 0.450–4.500)

## 2014-09-14 MED ORDER — ATORVASTATIN CALCIUM 40 MG PO TABS: 40.0000 mg | ORAL_TABLET | Freq: Every day | ORAL | Status: DC

## 2014-09-14 NOTE — Progress Notes (Signed)
lmtcb

## 2014-09-27 ENCOUNTER — Telehealth: Payer: Self-pay | Admitting: Family Medicine

## 2014-09-27 MED ORDER — PHENTERMINE HCL 37.5 MG PO CAPS: 37.5000 mg | ORAL_CAPSULE | ORAL | Status: DC

## 2014-09-27 NOTE — Telephone Encounter (Signed)
Phentermine printed

## 2014-09-28 ENCOUNTER — Telehealth: Payer: Self-pay

## 2014-09-28 NOTE — Telephone Encounter (Signed)
Patient wife aware

## 2014-09-28 NOTE — Telephone Encounter (Signed)
Insurance denied prior authorization for Belviq  Needs to previously tried and failed 3 months of Phentermine

## 2014-09-28 NOTE — Telephone Encounter (Signed)
I wrote the phentermine prescription for him

## 2014-10-14 ENCOUNTER — Telehealth: Payer: Self-pay

## 2014-10-14 NOTE — Telephone Encounter (Signed)
Phentermine prior approved by insurance

## 2015-11-21 ENCOUNTER — Telehealth: Payer: Self-pay | Admitting: Family Medicine

## 2015-11-22 NOTE — Telephone Encounter (Signed)
Scott David is getting chart from [facility]

## 2015-12-01 ENCOUNTER — Ambulatory Visit (INDEPENDENT_AMBULATORY_CARE_PROVIDER_SITE_OTHER): Payer: BC Managed Care – PPO | Admitting: Family

## 2015-12-01 ENCOUNTER — Encounter: Payer: Self-pay | Admitting: Family

## 2015-12-01 VITALS — BP 128/77 | HR 86 | Temp 98.8°F | Ht 72.0 in | Wt 322.0 lb

## 2015-12-01 DIAGNOSIS — M25562 Pain in left knee: Secondary | ICD-10-CM | POA: Diagnosis not present

## 2015-12-01 NOTE — Patient Instructions (Signed)

## 2015-12-01 NOTE — Progress Notes (Signed)
   Subjective:    Patient ID: Scott David, male    DOB: [birth year], 55 y.o.   MRN: [id number]  Pt presents to the office with recurrent left knee pain. 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 Pt went to the doctor and was given a note to upgrade his vehicle. PT started driving a [place] that he was able to "stretch" his left knee and has not had any pain since. Pt states he needs an updated work note stating he needs a vehicle with leg room.  Knee Pain  The incident occurred more than 1 week ago. The pain is present in the left knee. He reports no foreign bodies present. He has tried rest for the symptoms. The treatment provided moderate relief.      Review of Systems  Constitutional: Negative.   HENT: Negative.   Respiratory: Negative.   Cardiovascular: Negative.   Gastrointestinal: Negative.   Endocrine: Negative.   Genitourinary: Negative.   Musculoskeletal: Positive for joint swelling and gait problem.  Neurological: Negative.   Hematological: Negative.   Psychiatric/Behavioral: Negative.   All other systems reviewed and are negative.      Objective:   Physical Exam  Constitutional: He is oriented to person, place, and time. He appears well-developed and well-nourished. No distress.  Obese   HENT:  Head: Normocephalic.  Right Ear: External ear normal.  Left Ear: External ear normal.  Nose: Nose normal.  Mouth/Throat: Oropharynx is clear and moist.  Eyes: Pupils are equal, round, and reactive to light. Right eye exhibits no discharge. Left eye exhibits no discharge.  Neck: Normal range of motion. Neck supple. No thyromegaly present.  Cardiovascular: Normal rate, regular rhythm, normal heart sounds and intact distal pulses.   No murmur heard. Pulmonary/Chest: Effort normal and breath sounds normal. No respiratory distress. He has no wheezes.  Abdominal: Soft.  Bowel sounds are normal. He exhibits no distension. There is no tenderness.  Musculoskeletal: Normal range of motion. He exhibits no edema or tenderness.  Neurological: He is alert and oriented to person, place, and time.  Skin: Skin is warm and dry. No rash noted. No erythema.  Psychiatric: He has a normal mood and affect. His behavior is normal. Judgment and thought content normal.  Vitals reviewed.     BP 128/77 mmHg  Pulse 86  Temp(Src) 98.8 F (37.1 C) (Oral)  Ht 6' (1.829 m)  Wt 322 lb (146.058 kg)  BMI 43.66 kg/m2     Assessment & Plan:  1. Left knee pain -Continue ROM exercises -Motrin prn  -Ice as needed -Work note written for pt -RTO prn  Evelina Dun, FNP

## 2015-12-06 NOTE — Telephone Encounter (Signed)
Lmom to advise patient of this.

## 2015-12-06 NOTE — Telephone Encounter (Signed)
Chart received from [place], but no letter was found in the chart relating to the issue of him needing a larger vehicle because of his knee problems.

## 2016-01-09 ENCOUNTER — Ambulatory Visit: Payer: BC Managed Care – PPO | Admitting: Family Medicine

## 2016-01-20 ENCOUNTER — Ambulatory Visit: Payer: BC Managed Care – PPO | Admitting: Family Medicine

## 2017-07-12 ENCOUNTER — Ambulatory Visit: Payer: BC Managed Care – PPO | Admitting: Family Medicine

## 2017-07-18 ENCOUNTER — Ambulatory Visit (INDEPENDENT_AMBULATORY_CARE_PROVIDER_SITE_OTHER): Payer: BC Managed Care – PPO

## 2017-07-18 ENCOUNTER — Encounter: Payer: Self-pay | Admitting: Family Medicine

## 2017-07-18 ENCOUNTER — Ambulatory Visit: Payer: BC Managed Care – PPO | Admitting: Family Medicine

## 2017-07-18 VITALS — BP 129/74 | HR 87 | Temp 98.2°F | Ht 72.0 in | Wt 330.0 lb

## 2017-07-18 DIAGNOSIS — M25511 Pain in right shoulder: Secondary | ICD-10-CM | POA: Diagnosis not present

## 2017-07-18 DIAGNOSIS — M19011 Primary osteoarthritis, right shoulder: Secondary | ICD-10-CM | POA: Diagnosis not present

## 2017-07-18 MED ORDER — BETAMETHASONE SOD PHOS & ACET 6 (3-3) MG/ML IJ SUSP
6.0000 mg | Freq: Once | INTRAMUSCULAR | Status: AC
  Administered 2017-07-18: 6 mg via INTRAMUSCULAR

## 2017-07-18 MED ORDER — DICLOFENAC SODIUM 75 MG PO TBEC: 75.0000 mg | DELAYED_RELEASE_TABLET | Freq: Two times a day (BID) | ORAL | 1 refills | Status: DC

## 2017-07-18 NOTE — Progress Notes (Signed)
Chief Complaint  Patient presents with  . Shoulder Pain    pt here today c/o right shoulder pain x 1-2 months    HPI  Patient presents today for continued pain in the shoulder for several weeks.  He has been told in the past that it had arthritis.  He has had injections in the past that were useful.  He would like to have repeat today.  Pain is 6-7/10.  He points to the anterior subacromial space.  PMH: Smoking status noted ROS: Per HPI  Objective: BP 129/74   Pulse 87   Temp 98.2 F (36.8 C) (Oral)   Ht 6' (1.829 m)   Wt (!) 330 lb (149.7 kg)   BMI 44.76 kg/m  Gen: NAD, alert, cooperative with exam HEENT: NCAT, EOMI, PERRL CV: RRR, good S1/S2, no murmur Resp: CTABL, no wheezes, non-labored Extremities: The right shoulder has limited range of motion.  It is weak for abduction and abduction.  Strength is actually good for rotation.  There is tenderness for resisted rotation at the insertion area of the rotator cuff Neuro: Alert and oriented, No gross deficits X-ray reveals significant arthritis of the right shoulder with multiple bone spurs and possible foreign body.    Assessment and plan:  1. Acute pain of right shoulder   2. Arthritis of right shoulder region   A steroid injection was performed at the right subacromial space using Marcaine and 2.5 cc and 9 mg of Celestone. This was well tolerated.   Meds ordered this encounter  Medications  . diclofenac (VOLTAREN) 75 MG EC tablet    Sig: Take 1 tablet (75 mg total) by mouth 2 (two) times daily.    Dispense:  60 tablet    Refill:  1  . betamethasone acetate-betamethasone sodium phosphate (CELESTONE) injection 6 mg    Orders Placed This Encounter  Procedures  . DG Shoulder Right    Standing Status:   Future    Number of Occurrences:   1    Standing Expiration Date:   09/17/2018    Order Specific Question:   Reason for Exam (SYMPTOM  OR DIAGNOSIS REQUIRED)    Answer:   shoulder pain    Order Specific Question:    Preferred imaging location?    Answer:   Internal    Follow up as needed.  Claretta Fraise, MD

## 2017-08-23 ENCOUNTER — Encounter: Payer: BC Managed Care – PPO | Admitting: Family Medicine

## 2017-08-26 ENCOUNTER — Encounter: Payer: Self-pay | Admitting: Family Medicine

## 2017-10-01 ENCOUNTER — Other Ambulatory Visit: Payer: Self-pay | Admitting: Family Medicine

## 2017-11-09 ENCOUNTER — Other Ambulatory Visit: Payer: Self-pay | Admitting: Family Medicine

## 2018-10-13 ENCOUNTER — Ambulatory Visit (INDEPENDENT_AMBULATORY_CARE_PROVIDER_SITE_OTHER): Payer: BC Managed Care – PPO

## 2018-10-13 ENCOUNTER — Other Ambulatory Visit: Payer: Self-pay

## 2018-10-13 ENCOUNTER — Encounter: Payer: Self-pay | Admitting: Family Medicine

## 2018-10-13 ENCOUNTER — Ambulatory Visit: Payer: BC Managed Care – PPO | Admitting: Family Medicine

## 2018-10-13 VITALS — BP 126/72 | HR 65 | Temp 98.1°F | Ht 72.0 in | Wt 324.0 lb

## 2018-10-13 DIAGNOSIS — Z125 Encounter for screening for malignant neoplasm of prostate: Secondary | ICD-10-CM

## 2018-10-13 DIAGNOSIS — M1711 Unilateral primary osteoarthritis, right knee: Secondary | ICD-10-CM | POA: Diagnosis not present

## 2018-10-13 DIAGNOSIS — M25561 Pain in right knee: Secondary | ICD-10-CM | POA: Diagnosis not present

## 2018-10-13 DIAGNOSIS — E559 Vitamin D deficiency, unspecified: Secondary | ICD-10-CM

## 2018-10-13 DIAGNOSIS — E782 Mixed hyperlipidemia: Secondary | ICD-10-CM

## 2018-10-13 MED ORDER — BETAMETHASONE SOD PHOS & ACET 6 (3-3) MG/ML IJ SUSP
9.0000 mg | Freq: Once | INTRAMUSCULAR | Status: AC
  Administered 2018-10-13: 10:00:00 9 mg via INTRA_ARTICULAR

## 2018-10-13 MED ORDER — DICLOFENAC SODIUM 75 MG PO TBEC
75.0000 mg | DELAYED_RELEASE_TABLET | Freq: Two times a day (BID) | ORAL | 5 refills | Status: DC
Start: 2018-10-13 — End: 2019-02-03

## 2018-10-13 NOTE — Progress Notes (Signed)
Subjective:  Patient ID: Scott David, male    DOB: 1960/09/11  Age: 58 y.o. MRN: [id number]  CC: right knee pain (x 1 mo)   HPI Scott David presents for history of right knee pain.  This is been chronic over years and Scott David was told in the past that Scott David could have a knee replacement.  Scott David decided against that.  Scott David has been doing well with it until about 1 month ago Scott David stepped off the back of a trailer and felt a pop in the lateral aspect of the right knee.  Scott David has been having moderate pain ever since.  It seems to be getting slightly better but is still debilitating and making activities more uncomfortable.  Scott David is able to ambulate but has to favor that leg.  Scott David had a shot in the knee many years ago, approximately 25 years ago, and would like to have another injection since that one had worked so well.  Depression screen [facility] 2/9 10/13/2018 07/18/2017 12/01/2015  Decreased Interest 0 0 0  Down, Depressed, Hopeless 0 0 0  PHQ - 2 Score 0 0 0    History Scott David has a past medical history of At risk for sleep apnea, B12 deficiency, Elevated PSA, History of concussion, Hyperlipidemia, Hypogonadism male, Idiopathic peripheral neuropathy, PONV (postoperative nausea and vomiting), and Vitamin D deficiency.   Scott David has a past surgical history that includes Colonoscopy with propofol (2014).   His family history includes Cancer in his brother, sister, and sister; Cancer (age of onset: 15) in his father; Heart disease in his father.Scott David reports that Scott David has never smoked. Scott David has never used smokeless tobacco. Scott David reports that Scott David does not drink alcohol or use drugs.    ROS Review of Systems  Constitutional: Negative for fever.  Respiratory: Negative for shortness of breath.   Cardiovascular: Negative for chest pain.  Musculoskeletal: Positive for arthralgias.  Skin: Negative for rash.    Objective:  BP 126/72 (BP Location: Left Arm)   Pulse 65   Temp 98.1 F (36.7 C) (Oral)   Ht 6' (1.829 m)   Wt (!) 324  lb (147 kg)   BMI 43.94 kg/m   BP Readings from Last 3 Encounters:  10/13/18 126/72  07/18/17 129/74  12/01/15 128/77    Wt Readings from Last 3 Encounters:  10/13/18 (!) 324 lb (147 kg)  07/18/17 (!) 330 lb (149.7 kg)  12/01/15 (!) 322 lb (146.1 kg)     Physical Exam Vitals signs reviewed.  Constitutional:      Appearance: Scott David is well-developed.  HENT:     Head: Normocephalic and atraumatic.     Mouth/Throat:     Pharynx: No oropharyngeal exudate or posterior oropharyngeal erythema.  Eyes:     Pupils: Pupils are equal, round, and reactive to light.  Neck:     Musculoskeletal: Normal range of motion and neck supple.  Cardiovascular:     Rate and Rhythm: Normal rate and regular rhythm.     Heart sounds: No murmur.  Pulmonary:     Effort: No respiratory distress.  Musculoskeletal: Normal range of motion.        General: Swelling (  Lateral aspect of the joint line for the right knee.  The patient has a negative drawer and Lockman sign.  There is a positive McMurray sign laterally.  There is some tenderness for opening of the left lateral collateral ligament. ) present.  Neurological:     Mental Status: Scott David  is alert and oriented to person, place, and time.     X-ray right knee: Medial joint compartment moderately severe arthritis with near bone-on-bone presents.  The lateral compartment is preserved   A steroid injection was performed at the lateral patellar plica of the right knee after cleansing with Hibiclens due to patient's allergy to Betadine.  Using ethyl chloride for topical anesthesia, the joint was injected with 3.5 mL Narcan and 9 mg of Celestone. This was well tolerated.  No complications noted.  Band-Aid dressing applied.   Assessment & Plan:   Scott David was seen today for right knee pain.  Diagnoses and all orders for this visit:  Primary osteoarthritis of right knee -     CBC with Differential/Platelet -     CMP14+EGFR -     Lipid panel -     TSH -      VITAMIN D 25 Hydroxy (Vit-D Deficiency, Fractures) -     betamethasone acetate-betamethasone sodium phosphate (CELESTONE) injection 9 mg  Right knee pain, unspecified chronicity -     DG Knee 1-2 Views Right; Future  Morbid obesity (HCC) -     CBC with Differential/Platelet -     CMP14+EGFR -     Lipid panel -     TSH -     VITAMIN D 25 Hydroxy (Vit-D Deficiency, Fractures)  Mixed hyperlipidemia -     CBC with Differential/Platelet -     CMP14+EGFR -     Lipid panel -     TSH -     VITAMIN D 25 Hydroxy (Vit-D Deficiency, Fractures)  Screening for prostate cancer -     PSA Total (Reflex To Free)  Other orders -     diclofenac (VOLTAREN) 75 MG EC tablet; Take 1 tablet (75 mg total) by mouth 2 (two) times daily. With food for arthritis       I have changed Scott David's diclofenac. I am also having him maintain his Scott David. We administered betamethasone acetate-betamethasone sodium phosphate.  Allergies as of 10/13/2018      Reactions   Fish Allergy Swelling   ONLY EATS FLOUDER    Shellfish Allergy Shortness Of Breath, Swelling   CRAB/   SHRIMP-- JUST SWELLING       Medication List       Accurate as of Oct 13, 2018 12:14 PM. If you have any questions, ask your nurse or doctor.        diclofenac 75 MG EC tablet Commonly known as:  VOLTAREN Take 1 tablet (75 mg total) by mouth 2 (two) times daily. With food for arthritis What changed:  additional instructions Changed by:  Claretta Fraise, MD   OVER THE COUNTER MEDICATION mvi - b 12        Follow-up: Return in about 3 months (around 01/13/2019), or if symptoms worsen or fail to improve, for Wellness, Pain.  Claretta Fraise, M.D.

## 2018-10-14 ENCOUNTER — Other Ambulatory Visit: Payer: Self-pay | Admitting: Family Medicine

## 2018-10-14 DIAGNOSIS — R972 Elevated prostate specific antigen [PSA]: Secondary | ICD-10-CM

## 2018-10-15 LAB — TSH: TSH: 3.58 u[IU]/mL (ref 0.450–4.500)

## 2018-10-15 LAB — CMP14+EGFR
ALT: 40 IU/L (ref 0–44)
AST: 28 IU/L (ref 0–40)
Albumin/Globulin Ratio: 2.4 — ABNORMAL HIGH (ref 1.2–2.2)
Albumin: 5.2 g/dL — ABNORMAL HIGH (ref 3.8–4.9)
Alkaline Phosphatase: 52 IU/L (ref 39–117)
BUN/Creatinine Ratio: 13 (ref 9–20)
BUN: 19 mg/dL (ref 6–24)
Bilirubin Total: 0.4 mg/dL (ref 0.0–1.2)
CO2: 22 mmol/L (ref 20–29)
Calcium: 10 mg/dL (ref 8.7–10.2)
Chloride: 102 mmol/L (ref 96–106)
Creatinine, Ser: 1.42 mg/dL — ABNORMAL HIGH (ref 0.76–1.27)
GFR calc Af Amer: 63 mL/min/{1.73_m2} (ref >59)
GFR calc non Af Amer: 55 mL/min/{1.73_m2} — ABNORMAL LOW (ref >59)
Globulin, Total: 2.2 g/dL (ref 1.5–4.5)
Glucose: 95 mg/dL (ref 65–99)
Potassium: 4.6 mmol/L (ref 3.5–5.2)
Sodium: 137 mmol/L (ref 134–144)
Total Protein: 7.4 g/dL (ref 6.0–8.5)

## 2018-10-15 LAB — CBC WITH DIFFERENTIAL/PLATELET
Basophils Absolute: 0 10*3/uL (ref 0.0–0.2)
Basos: 1 %
EOS (ABSOLUTE): 0.2 10*3/uL (ref 0.0–0.4)
Eos: 2 %
Hematocrit: 38.1 % (ref 37.5–51.0)
Hemoglobin: 12.9 g/dL — ABNORMAL LOW (ref 13.0–17.7)
Immature Grans (Abs): 0 10*3/uL (ref 0.0–0.1)
Immature Granulocytes: 0 %
Lymphocytes Absolute: 2.4 10*3/uL (ref 0.7–3.1)
Lymphs: 30 %
MCH: 28.4 pg (ref 26.6–33.0)
MCHC: 33.9 g/dL (ref 31.5–35.7)
MCV: 84 fL (ref 79–97)
Monocytes Absolute: 0.4 10*3/uL (ref 0.1–0.9)
Monocytes: 5 %
Neutrophils Absolute: 4.9 10*3/uL (ref 1.4–7.0)
Neutrophils: 62 %
Platelets: 416 10*3/uL (ref 150–450)
RBC: 4.55 x10E6/uL (ref 4.14–5.80)
RDW: 11 % — ABNORMAL LOW (ref 11.6–15.4)
WBC: 7.8 10*3/uL (ref 3.4–10.8)

## 2018-10-15 LAB — LIPID PANEL
Chol/HDL Ratio: 4.6 ratio (ref 0.0–5.0)
Cholesterol, Total: 204 mg/dL — ABNORMAL HIGH (ref 100–199)
HDL: 44 mg/dL (ref >39)
LDL Calculated: 127 mg/dL — ABNORMAL HIGH (ref 0–99)
Triglycerides: 167 mg/dL — ABNORMAL HIGH (ref 0–149)
VLDL Cholesterol Cal: 33 mg/dL (ref 5–40)

## 2018-10-15 LAB — FPSA% REFLEX
% FREE PSA: 7.4 %
PSA, FREE: 0.7 ng/mL

## 2018-10-15 LAB — PSA TOTAL (REFLEX TO FREE): Prostate Specific Ag, Serum: 9.4 ng/mL — ABNORMAL HIGH (ref 0.0–4.0)

## 2018-10-15 LAB — VITAMIN D 25 HYDROXY (VIT D DEFICIENCY, FRACTURES): Vit D, 25-Hydroxy: 25.9 ng/mL — ABNORMAL LOW (ref 30.0–100.0)

## 2018-10-15 MED ORDER — VITAMIN D (ERGOCALCIFEROL) 1.25 MG (50000 UNIT) PO CAPS: 50000.0000 [IU] | ORAL_CAPSULE | ORAL | 1 refills | Status: DC

## 2018-10-15 NOTE — Addendum Note (Signed)
Addended by: Shelbie Ammons on: 10/15/2018 11:01 AM   Modules accepted: Orders

## 2019-01-06 ENCOUNTER — Other Ambulatory Visit (HOSPITAL_COMMUNITY): Payer: Self-pay | Admitting: Urology

## 2019-01-06 ENCOUNTER — Other Ambulatory Visit: Payer: Self-pay | Admitting: Urology

## 2019-01-06 DIAGNOSIS — C61 Malignant neoplasm of prostate: Secondary | ICD-10-CM

## 2019-01-14 ENCOUNTER — Other Ambulatory Visit: Payer: Self-pay

## 2019-01-14 ENCOUNTER — Encounter (HOSPITAL_COMMUNITY)
Admission: RE | Admit: 2019-01-14 | Discharge: 2019-01-14 | Disposition: A | Discharge status: Home / Self Care | Payer: BC Managed Care – PPO | Source: Ambulatory Visit | Attending: Urology | Admitting: Urology

## 2019-01-14 ENCOUNTER — Encounter: Payer: Self-pay | Admitting: Family Medicine

## 2019-01-14 DIAGNOSIS — C61 Malignant neoplasm of prostate: Secondary | ICD-10-CM | POA: Diagnosis not present

## 2019-01-14 MED ORDER — TECHNETIUM TC 99M MEDRONATE IV KIT
20.0000 | PACK | Freq: Once | INTRAVENOUS | Status: AC | PRN
  Administered 2019-01-14: 11:00:00 21.1 via INTRAVENOUS

## 2019-01-29 ENCOUNTER — Other Ambulatory Visit: Payer: Self-pay | Admitting: Podiatry

## 2019-01-29 DIAGNOSIS — M7989 Other specified soft tissue disorders: Secondary | ICD-10-CM

## 2019-02-02 ENCOUNTER — Encounter: Payer: Self-pay | Admitting: Radiation Oncology

## 2019-02-02 NOTE — Progress Notes (Signed)
Radiation Oncology         [phone] ________________________________  Initial outpatient Consultation - Conducted via WebEx due to current COVID-19 concerns for limiting patient exposure  Name: Scott David MRN: [id number]  Date: 02/03/2019  DOB: 10-Oct-1960  OB:[id number], Cletus Gash, MD  Lucas Mallow, MD   REFERRING PHYSICIAN: Lucas Mallow, MD  DIAGNOSIS: 58 y.o. gentleman with Stage T1c adenocarcinoma of the prostate with Gleason score of 3+4, and PSA of 5.96.    ICD-10-CM   1. Malignant neoplasm of prostate ([place])  C61     HISTORY OF PRESENT ILLNESS: Scott David is a 58 y.o. male with a diagnosis of prostate cancer. He was initially referred to Dr. Janice Norrie in 2015 for a PSA of around 3.3 but opted not to undergo prostate biopsy at that time due to cost issues. More recently, he was noted to have an elevated PSA of 9.4 in 09/2018 by his primary care physician, Dr. Livia Snellen.  Accordingly, he was referred for evaluation in urology by Dr. Lovena Neighbours on 11/20/2018,  digital rectal examination was performed at that time revealing no nodules. Repeat PSA performed that same day had decreased to 5.96 but remained elevated above the normal range. Therefore, the patient proceeded to transrectal ultrasound with 12 biopsies of the prostate on 12/25/2018.  The prostate volume measured 42 cc.  Out of 12 core biopsies, 11 were positive.  The maximum Gleason score was 3+4, and this was seen in right base lateral, right mid, and left mid lateral. Gleason 3+3 was seen in left base lateral, left apex lateral, left base, left mid, left apex, right apex, right mid lateral, and right apex lateral.  He underwent CT C/A/P on 01/14/2019, which was negative for LAN or visceral metastatic disease. Bone scan performed the same day was also negative for ossesous metastatic disease.  He was referred to Dr. Gloriann Loan on 01/21/2019 for consideration of prostatectomy but is adamantly not interested in surgery.  The patient reviewed  the biopsy results with his urologist and he has kindly been referred today for discussion of potential radiation treatment options.  PREVIOUS RADIATION THERAPY: No  PAST MEDICAL HISTORY:  Past Medical History:  Diagnosis Date  . At risk for sleep apnea    STOP-BANG=  4   SENT TO PCP 08-10-2013  . B12 deficiency   . Elevated PSA   . History of concussion    JAN 2013--  SLIGHT MEMORY ISSUES WHICH ARE IMPROVING  . Hyperlipidemia   . Hypogonadism male   . Idiopathic peripheral neuropathy   . PONV (postoperative nausea and vomiting)   . Prostate cancer ([place])   . Vitamin D deficiency       PAST SURGICAL HISTORY: Past Surgical History:  Procedure Laterality Date  . COLONOSCOPY WITH PROPOFOL  2014  . PROSTATE BIOPSY      FAMILY HISTORY:  Family History  Problem Relation Age of Onset  . Cancer Father 21       metastatic prostate  . Heart disease Father   . Cancer Sister        colon, uterine  . Cancer Brother        prostate  . Cancer Sister        breast    SOCIAL HISTORY:  Social History   Socioeconomic History  . Marital status: Married    Spouse name: Not on file  . Number of children: 4  . Years of education: Not on file  .  Highest education level: Not on file  Occupational History    Comment: working full time  Social Needs  . Financial resource strain: Not on file  . Food insecurity    Worry: Not on file    Inability: Not on file  . Transportation needs    Medical: Not on file    Non-medical: Not on file  Tobacco Use  . Smoking status: Never Smoker  . Smokeless tobacco: Never Used  Substance and Sexual Activity  . Alcohol use: No  . Drug use: No  . Sexual activity: Yes  Lifestyle  . Physical activity    Days per week: Not on file    Minutes per session: Not on file  . Stress: Not on file  Relationships  . Social Herbalist on phone: Not on file    Gets together: Not on file    Attends religious service: Not on file    Active  member of club or organization: Not on file    Attends meetings of clubs or organizations: Not on file    Relationship status: Not on file  . Intimate partner violence    Fear of current or ex partner: Not on file    Emotionally abused: Not on file    Physically abused: Not on file    Forced sexual activity: Not on file  Other Topics Concern  . Not on file  Social History Narrative  . Not on file    ALLERGIES: Fish allergy and Shellfish allergy  MEDICATIONS:  Current Outpatient Medications  Medication Sig Dispense Refill  . NON FORMULARY [facility] Deep Relief Roll On once daily on right foot.    . Vitamin D, Ergocalciferol, (DRISDOL) 1.25 MG (50000 UT) CAPS capsule Take 50,000 Units by mouth every 7 (seven) days. On Sunday    . diclofenac (VOLTAREN) 75 MG EC tablet Take 75 mg by mouth 2 (two) times daily.     No current facility-administered medications for this encounter.     REVIEW OF SYSTEMS:  On review of systems, the patient reports that he is doing well overall. He denies any chest pain, shortness of breath, cough, fevers, chills, night sweats, unintended weight changes. He denies any bowel disturbances, and denies abdominal pain, nausea or vomiting. He denies any new musculoskeletal or joint aches or pains. His IPSS was 1, indicating mild to no urinary symptoms. He reports urinary leakage at baseline. His SHIM was 25, indicating he does not have erectile dysfunction. A complete review of systems is obtained and is otherwise negative.    PHYSICAL EXAM:  Wt Readings from Last 3 Encounters:  02/03/19 (!) 317 lb (143.8 kg)  10/13/18 (!) 324 lb (147 kg)  07/18/17 (!) 330 lb (149.7 kg)   Temp Readings from Last 3 Encounters:  10/13/18 98.1 F (36.7 C) (Oral)  07/18/17 98.2 F (36.8 C) (Oral)  12/01/15 98.8 F (37.1 C) (Oral)   BP Readings from Last 3 Encounters:  10/13/18 126/72  07/18/17 129/74  12/01/15 128/77   Pulse Readings from Last 3 Encounters:  10/13/18  65  07/18/17 87  12/01/15 86   Pain Assessment Pain Score: 0-No pain/10  In general this is a well appearing African American gentleman in no acute distress. He's alert and oriented x4 and appropriate throughout the examination. Cardiopulmonary assessment is negative for acute distress and he exhibits normal effort.   KPS = 100  100 - Normal; no complaints; no evidence of disease. 90   -  Able to carry on normal activity; minor signs or symptoms of disease. 80   - Normal activity with effort; some signs or symptoms of disease. 4   - Cares for self; unable to carry on normal activity or to do active work. 60   - Requires occasional assistance, but is able to care for most of his personal needs. 50   - Requires considerable assistance and frequent medical care. 37   - Disabled; requires special care and assistance. 55   - Severely disabled; hospital admission is indicated although death not imminent. 62   - Very sick; hospital admission necessary; active supportive treatment necessary. 10   - Moribund; fatal processes progressing rapidly. 0     - Dead  Karnofsky DA, Abelmann WH, Craver LS and [facility] [phone]) The use of the nitrogen mustards in the palliative treatment of carcinoma: with particular reference to bronchogenic carcinoma Cancer 1 634-56  LABORATORY DATA:  Lab Results  Component Value Date   WBC 7.8 10/13/2018   HGB 12.9 (L) 10/13/2018   HCT 38.1 10/13/2018   MCV 84 10/13/2018   PLT 416 10/13/2018   Lab Results  Component Value Date   NA 137 10/13/2018   K 4.6 10/13/2018   CL 102 10/13/2018   CO2 22 10/13/2018   Lab Results  Component Value Date   ALT 40 10/13/2018   AST 28 10/13/2018   ALKPHOS 52 10/13/2018   BILITOT 0.4 10/13/2018     RADIOGRAPHY: Nm Bone Scan Whole Body  Result Date: 01/14/2019 CLINICAL DATA:  Prostate cancer EXAM: NUCLEAR MEDICINE WHOLE BODY BONE SCAN TECHNIQUE: Whole body anterior and posterior images were obtained approximately 3  hours after intravenous injection of radiopharmaceutical. RADIOPHARMACEUTICALS:  21.1 mCi Technetium-44m MDP IV COMPARISON:  None. FINDINGS: No areas of suspicious osseous uptake to suggest osseous metastatic disease. Degenerative uptake in the knees and ankle/feet bilaterally. Soft tissue activity unremarkable. IMPRESSION: No evidence of osseous metastatic disease. Electronically Signed   By: Rolm Baptise M.D.   On: 01/14/2019 15:35      IMPRESSION/PLAN: 1. 58 y.o. gentleman with Stage T1c adenocarcinoma of the prostate with Gleason Score of 3+4, and PSA of 5.96.  We discussed the patient's workup and outlined the nature of prostate cancer in this setting. The patient's T stage, Gleason's score, and PSA put him into the favorable intermediate risk group. Accordingly, he is eligible for a variety of potential treatment options including brachytherapy, 5.5 weeks of external radiation, or prostatectomy. We discussed the available radiation techniques, and focused on the details and logistics and delivery. We discussed and outlined the risks, benefits, short and long-term effects associated with radiotherapy and compared and contrasted these with prostatectomy. We discussed the role of SpaceOAR in reducing the rectal toxicity associated with radiotherapy.  He was encouraged to ask questions that were answered to his stated satisfaction.  At the conclusion of our conversation, the patient elects to proceed with brachytherapy and use of SpaceOAR to reduce rectal toxicity from radiotherapy.  We will share our discussion with Dr. Lovena Neighbours and move forward with coordinating this procedure and scheduling his CT [facility] planning appointment in the near future.  The patient will be introduced to Romie Jumper in our office who will be working closely with him to coordinate OR scheduling and pre and post procedure appointments.  We will contact the pharmaceutical rep to ensure that [employer] is available at the time of  procedure.  He will have a prostate MRI following his post-seed  CT SIM to confirm appropriate distribution of the SpaceOAR.  Given current concerns for patient exposure during the COVID-19 pandemic, this encounter was conducted via video-enabled WebEx visit. The patient has given verbal consent for this type of encounter. The time spent during this encounter was 80 minutes. The attendants for this meeting include Tyler Pita MD, Ashlyn Bruning PA-C, Katie Daubenspeck- scribe, patient, Edmond  Economos and his wife. During the encounter, Tyler Pita MD, Ashlyn Bruning PA-C, and scribe, Wilburn Mylar were located at [place] [facility].  Patient, Janis Suhre and his wife were located at home.    Nicholos Johns, PA-C    Tyler Pita, MD  [facility] Direct Dial: [phone]  Fax: [phone] [email or web address]  Skype  [email or web address]  This document serves as a record of services personally performed by Tyler Pita, MD and Freeman Caldron, PA-C. It was created on their behalf by Wilburn Mylar, a trained medical scribe. The creation of this record is based on the scribe's personal observations and the provider's statements to them. This document has been checked and approved by the attending provider.

## 2019-02-03 ENCOUNTER — Encounter: Payer: Self-pay | Admitting: Radiation Oncology

## 2019-02-03 ENCOUNTER — Ambulatory Visit
Admission: RE | Admit: 2019-02-03 | Discharge: 2019-02-03 | Disposition: A | Discharge status: Home / Self Care | Payer: BC Managed Care – PPO | Source: Ambulatory Visit | Attending: Radiation Oncology | Admitting: Radiation Oncology

## 2019-02-03 ENCOUNTER — Other Ambulatory Visit: Payer: Self-pay

## 2019-02-03 VITALS — Ht 72.0 in | Wt 317.0 lb

## 2019-02-03 DIAGNOSIS — C61 Malignant neoplasm of prostate: Secondary | ICD-10-CM | POA: Insufficient documentation

## 2019-02-03 HISTORY — DX: Malignant neoplasm of prostate: C61

## 2019-02-03 NOTE — Progress Notes (Signed)
See progress note under physician encounter. 

## 2019-02-03 NOTE — Progress Notes (Addendum)
GU Location of Tumor / Histology: prostatic adenocarcinoma  If Prostate Cancer, Gleason Score is (3 + 4) and PSA is (5.96). Prostate volume: 42 grams.  Scott David was noted to have an elevated PSA in 2014 but refused biopsy with Dr. Janice Norrie after learning anesthesia would cost $6000. Fast forward to 09/2018 when PCP noted PSA was 9.4. Patient was referred back to [facility] and PSA was repeated.    Biopsies of prostate (if applicable) revealed:    Past/Anticipated interventions by urology, if any: DRE, prostate biopsy, CT chest, abd, pelvis (negative), bone scan (negative), referral for consideration of radiotherapy (patient leaning toward this option)  Past/Anticipated interventions by medical oncology, if any: no  Weight changes, if any: no  Bowel/Bladder complaints, if any: IPSS 1. SHIM 25. Denies dysuria or hematuria. Reports urinary leakage "for years."   Nausea/Vomiting, if any: no  Pain issues, if any:  no  SAFETY ISSUES:  Prior radiation? no  Pacemaker/ICD? no  Possible current pregnancy? no, male patient  Is the patient on methotrexate? no  Current Complaints / other details:  58 year old male. Married x 34 years with four children. Strong family hx of cancer.   Seeing Dr. Steffanie Rainwater, podiatrist in [place], about mass between big and second toe on right foot. MRI scheduled to rule out cancer. Patient reports neuropathy and restless leg.

## 2019-02-10 ENCOUNTER — Telehealth: Payer: Self-pay | Admitting: *Deleted

## 2019-02-10 NOTE — Telephone Encounter (Signed)
Called patient to ask questions, lvm for a return call

## 2019-02-11 ENCOUNTER — Telehealth: Payer: Self-pay | Admitting: *Deleted

## 2019-02-11 NOTE — Telephone Encounter (Signed)
Called patient to ask question, lvm for a return call 

## 2019-02-12 ENCOUNTER — Telehealth: Payer: Self-pay | Admitting: *Deleted

## 2019-02-12 ENCOUNTER — Telehealth: Payer: Self-pay | Admitting: Medical Oncology

## 2019-02-12 NOTE — Telephone Encounter (Signed)
Called patient to ask questions, spoke with patient 

## 2019-02-12 NOTE — Progress Notes (Signed)
Spoke with Scott David to introduce myself as the prostate nurse navigator and discuss my role. I was unable to meet him 9/8 when he consulted with Dr. Tammi Klippel. He states the WebEx went well and he has decided on brachytherapy. He spoke with Enid Derry this morning to discuss a surgery date. He was unaware that he would have to come in for several appointments. I discussed CT simulation and pre-surgical testing. All questions were answered. I gave him my office number and asked him to call me with questions or concerns.  He voiced understanding and thanked me for the follow up call.

## 2019-02-24 ENCOUNTER — Ambulatory Visit
Admission: RE | Admit: 2019-02-24 | Discharge: 2019-02-24 | Disposition: A | Discharge status: Home / Self Care | Payer: BC Managed Care – PPO | Source: Ambulatory Visit | Attending: Podiatry | Admitting: Podiatry

## 2019-02-24 ENCOUNTER — Other Ambulatory Visit: Payer: BC Managed Care – PPO

## 2019-02-24 DIAGNOSIS — M7989 Other specified soft tissue disorders: Secondary | ICD-10-CM

## 2019-02-24 MED ORDER — GADOBENATE DIMEGLUMINE 529 MG/ML IV SOLN
20.0000 mL | Freq: Once | INTRAVENOUS | Status: AC | PRN
  Administered 2019-02-24: 20 mL via INTRAVENOUS

## 2019-02-26 ENCOUNTER — Ambulatory Visit: Payer: BC Managed Care – PPO | Admitting: Urology

## 2019-02-26 ENCOUNTER — Ambulatory Visit: Payer: BC Managed Care – PPO | Admitting: Radiation Oncology

## 2019-03-03 DIAGNOSIS — R2241 Localized swelling, mass and lump, right lower limb: Secondary | ICD-10-CM | POA: Insufficient documentation

## 2019-04-01 ENCOUNTER — Other Ambulatory Visit: Payer: Self-pay

## 2019-04-01 ENCOUNTER — Encounter: Payer: Self-pay | Admitting: Family Medicine

## 2019-04-01 ENCOUNTER — Ambulatory Visit (INDEPENDENT_AMBULATORY_CARE_PROVIDER_SITE_OTHER): Payer: BC Managed Care – PPO | Admitting: Family Medicine

## 2019-04-01 VITALS — BP 129/80 | HR 78 | Temp 97.3°F | Ht 72.0 in | Wt 312.0 lb

## 2019-04-01 DIAGNOSIS — C61 Malignant neoplasm of prostate: Secondary | ICD-10-CM | POA: Diagnosis not present

## 2019-04-01 DIAGNOSIS — G629 Polyneuropathy, unspecified: Secondary | ICD-10-CM

## 2019-04-01 MED ORDER — PREGABALIN 50 MG PO CAPS: ORAL_CAPSULE | ORAL | 1 refills | Status: DC

## 2019-04-01 NOTE — Progress Notes (Signed)
Subjective:  Patient ID: Scott David, male    DOB: 1961-05-15  Age: 58 y.o. MRN: [id number]  CC: Medical Management of Chronic Issues (wants PSA lab work)   HPI Scott David presents for daily tingling, throbbing and numbness in the forefeet. Using essential oilswith partial relief. Interested in gabapentin. Pain is moderately intense.   Pt. Also recently dxed with prostate cancer. Prostatectomy recommended. He opted instead for an experimental treatment. He is six weeks out. He is due for a PSA.  Pt. Planning a move to [place] working for the new 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  Depression screen [facility] 2/9 04/01/2019 10/13/2018 07/18/2017  Decreased Interest 0 0 0  Down, Depressed, Hopeless 0 0 0  PHQ - 2 Score 0 0 0    History Scott David has a past medical history of At risk for sleep apnea, B12 deficiency, Elevated PSA, History of concussion, Hyperlipidemia, Hypogonadism male, Idiopathic peripheral neuropathy, PONV (postoperative nausea and vomiting), Prostate cancer ([place]), and Vitamin D deficiency.   He has a past surgical history that includes Colonoscopy with propofol (2014) and Prostate biopsy.   His family history includes Cancer in his brother, sister, and sister; Cancer (age of onset: 91) in his father; Heart disease in his father.He reports that he has never smoked. He has never used smokeless tobacco. He reports that he does not drink alcohol or use drugs.    ROS Review of Systems  Constitutional: Negative.   HENT: Negative.   Eyes: Negative for visual disturbance.  Respiratory: Negative for cough and shortness of breath.   Cardiovascular: Negative for chest pain and leg swelling.  Gastrointestinal: Negative for abdominal pain, diarrhea, nausea and vomiting.  Genitourinary: Negative for difficulty urinating.  Musculoskeletal: Negative for arthralgias and myalgias.  Skin: Negative for rash.  Neurological: Negative for headaches.   Psychiatric/Behavioral: Negative for sleep disturbance.    Objective:  BP 129/80   Pulse 78   Temp (!) 97.3 F (36.3 C) (Temporal)   Ht 6' (1.829 m)   Wt (!) 312 lb (141.5 kg)   SpO2 97%   BMI 42.31 kg/m   BP Readings from Last 3 Encounters:  04/01/19 129/80  10/13/18 126/72  07/18/17 129/74    Wt Readings from Last 3 Encounters:  04/01/19 (!) 312 lb (141.5 kg)  02/03/19 (!) 317 lb (143.8 kg)  10/13/18 (!) 324 lb (147 kg)     Physical Exam Constitutional:      General: He is not in acute distress.    Appearance: He is well-developed.  HENT:     Head: Normocephalic and atraumatic.     Right Ear: External ear normal.     Left Ear: External ear normal.     Nose: Nose normal.  Eyes:     Conjunctiva/sclera: Conjunctivae normal.     Pupils: Pupils are equal, round, and reactive to light.  Neck:     Musculoskeletal: Normal range of motion and neck supple.  Cardiovascular:     Rate and Rhythm: Normal rate and regular rhythm.     Heart sounds: Normal heart sounds. No murmur.  Pulmonary:     Effort: Pulmonary effort is normal. No respiratory distress.     Breath sounds: Normal breath sounds. No wheezing or rales.  Abdominal:     Palpations: Abdomen is soft.     Tenderness: There is no abdominal tenderness.  Musculoskeletal: Normal range of motion.  Skin:    General: Skin is warm and dry.  Neurological:     Mental Status: He is alert and oriented to person, place, and time.     Deep Tendon Reflexes: Reflexes are normal and symmetric.  Psychiatric:        Behavior: Behavior normal.        Thought Content: Thought content normal.        Judgment: Judgment normal.       Assessment & Plan:   Scott David was seen today for medical management of chronic issues.  Diagnoses and all orders for this visit:  Neuropathy -     CBC with Differential/Platelet -     CMP14+EGFR -     PSA Total (Reflex To Free) -     Ambulatory referral to Neurology  Malignant neoplasm of  prostate ([place]) -     PSA Total (Reflex To Free)  Other orders -     pregabalin (LYRICA) 50 MG capsule; 1 qhs X7 days , then 2 qhs X 7d, then 3 qhs X 7d, then 4 qhs       I have discontinued Scott David's diclofenac, Vitamin D (Ergocalciferol), and NON FORMULARY. I am also having him start on pregabalin.  Allergies as of 04/01/2019      Reactions   Fish Allergy Swelling   ONLY EATS FLOUDER    Shellfish Allergy Shortness Of Breath, Swelling   CRAB/   SHRIMP-- JUST SWELLING    Iodine Swelling      Medication List       Accurate as of April 01, 2019  9:01 AM. If you have any questions, ask your nurse or doctor.        STOP taking these medications   diclofenac 75 MG EC tablet Commonly known as: VOLTAREN Stopped by: Claretta Fraise, MD   NON FORMULARY Stopped by: Claretta Fraise, MD   Vitamin D (Ergocalciferol) 1.25 MG (50000 UT) Caps capsule Commonly known as: DRISDOL Stopped by: Claretta Fraise, MD     TAKE these medications   pregabalin 50 MG capsule Commonly known as: Lyrica 1 qhs X7 days , then 2 qhs X 7d, then 3 qhs X 7d, then 4 qhs Started by: Claretta Fraise, MD        Follow-up: No follow-ups on file.  Claretta Fraise, M.D.

## 2019-04-02 LAB — CMP14+EGFR
ALT: 26 IU/L (ref 0–44)
AST: 20 IU/L (ref 0–40)
Albumin/Globulin Ratio: 2 (ref 1.2–2.2)
Albumin: 5 g/dL — ABNORMAL HIGH (ref 3.8–4.9)
Alkaline Phosphatase: 61 IU/L (ref 39–117)
BUN/Creatinine Ratio: 14 (ref 9–20)
BUN: 19 mg/dL (ref 6–24)
Bilirubin Total: 0.4 mg/dL (ref 0.0–1.2)
CO2: 21 mmol/L (ref 20–29)
Calcium: 10.1 mg/dL (ref 8.7–10.2)
Chloride: 101 mmol/L (ref 96–106)
Creatinine, Ser: 1.39 mg/dL — ABNORMAL HIGH (ref 0.76–1.27)
GFR calc Af Amer: 65 mL/min/{1.73_m2} (ref >59)
GFR calc non Af Amer: 56 mL/min/{1.73_m2} — ABNORMAL LOW (ref >59)
Globulin, Total: 2.5 g/dL (ref 1.5–4.5)
Glucose: 90 mg/dL (ref 65–99)
Potassium: 4.7 mmol/L (ref 3.5–5.2)
Sodium: 138 mmol/L (ref 134–144)
Total Protein: 7.5 g/dL (ref 6.0–8.5)

## 2019-04-02 LAB — CBC WITH DIFFERENTIAL/PLATELET
Basophils Absolute: 0 10*3/uL (ref 0.0–0.2)
Basos: 0 %
EOS (ABSOLUTE): 0.2 10*3/uL (ref 0.0–0.4)
Eos: 2 %
Hematocrit: 39.4 % (ref 37.5–51.0)
Hemoglobin: 13 g/dL (ref 13.0–17.7)
Immature Grans (Abs): 0 10*3/uL (ref 0.0–0.1)
Immature Granulocytes: 0 %
Lymphocytes Absolute: 2.1 10*3/uL (ref 0.7–3.1)
Lymphs: 24 %
MCH: 28 pg (ref 26.6–33.0)
MCHC: 33 g/dL (ref 31.5–35.7)
MCV: 85 fL (ref 79–97)
Monocytes Absolute: 0.5 10*3/uL (ref 0.1–0.9)
Monocytes: 5 %
Neutrophils Absolute: 5.9 10*3/uL (ref 1.4–7.0)
Neutrophils: 69 %
Platelets: 372 10*3/uL (ref 150–450)
RBC: 4.64 x10E6/uL (ref 4.14–5.80)
RDW: 11.5 % — ABNORMAL LOW (ref 11.6–15.4)
WBC: 8.7 10*3/uL (ref 3.4–10.8)

## 2019-04-02 LAB — FPSA% REFLEX
% FREE PSA: 8.8 %
PSA, FREE: 0.6 ng/mL

## 2019-04-02 LAB — PSA TOTAL (REFLEX TO FREE): Prostate Specific Ag, Serum: 6.8 ng/mL — ABNORMAL HIGH (ref 0.0–4.0)

## 2019-04-02 NOTE — Progress Notes (Signed)
Hello Deano,  Your lab result is normal and/or stable.Some minor variations that are not significant are commonly marked abnormal, but do not represent any medical problem for you.  Best regards, Claretta Fraise, M.D.

## 2019-04-03 ENCOUNTER — Encounter: Payer: Self-pay | Admitting: Family Medicine

## 2019-04-20 ENCOUNTER — Other Ambulatory Visit: Payer: Self-pay

## 2019-04-20 ENCOUNTER — Encounter: Payer: Self-pay | Admitting: Diagnostic Neuroimaging

## 2019-04-20 ENCOUNTER — Ambulatory Visit: Payer: BC Managed Care – PPO | Admitting: Diagnostic Neuroimaging

## 2019-04-20 VITALS — BP 152/88 | HR 70 | Temp 97.4°F | Ht 72.0 in | Wt 312.0 lb

## 2019-04-20 DIAGNOSIS — R200 Anesthesia of skin: Secondary | ICD-10-CM | POA: Diagnosis not present

## 2019-04-20 DIAGNOSIS — R202 Paresthesia of skin: Secondary | ICD-10-CM | POA: Diagnosis not present

## 2019-04-20 NOTE — Progress Notes (Signed)
[facility]  PATIENT: Scott David DOB: January 06, 1961  REFERRING CLINICIAN: W Stacks HISTORY FROM: patient  REASON FOR VISIT: new consult    HISTORICAL  CHIEF COMPLAINT:  Chief Complaint  Patient presents with   Neuropathy    rm 6 New Pt, "neuropathy in my feet x 5-6 years, Lyrica is helping but not as much as it was"    HISTORY OF PRESENT ILLNESS:   58 year old male here for evaluation of numbness in feet.  Symptoms started in 2015.  He describes aching, throbbing, numbness sensation in his toes and bottom of feet.  Symptoms have gradually worsened over time.  He is tried over-the-counter essential oils and CBD oil with mild relief.  He started pregabalin several weeks ago and is up to 200 mg at bedtime with mild relief.  He has some low back pain but does not radiate to the legs.  No symptoms in his fingers or hands.  Symptoms are worse in the evening.  Patient has history of B12 deficiency in the past treated with injections several years ago.  Also had iron deficiency anemia in the past now improved.   REVIEW OF SYSTEMS: Full 14 system review of systems performed and negative with exception of: As per HPI.  ALLERGIES: Allergies  Allergen Reactions   Fish Allergy Swelling    ONLY EATS FLOUDER    Shellfish Allergy Shortness Of Breath and Swelling    CRAB/   SHRIMP-- JUST SWELLING    Iodine Swelling    HOME MEDICATIONS: Outpatient Medications Prior to Visit  Medication Sig Dispense Refill   pregabalin (LYRICA) 50 MG capsule 1 qhs X7 days , then 2 qhs X 7d, then 3 qhs X 7d, then 4 qhs 120 capsule 1   No facility-administered medications prior to visit.     PAST MEDICAL HISTORY: Past Medical History:  Diagnosis Date   At risk for sleep apnea    STOP-BANG=  4   SENT TO PCP 08-10-2013   B12 deficiency    Elevated PSA    History of concussion    JAN 2013--  SLIGHT MEMORY ISSUES WHICH ARE IMPROVING   Hyperlipidemia    Hypogonadism male     Idiopathic peripheral neuropathy    PONV (postoperative nausea and vomiting)    Prostate cancer ([place])    Vitamin D deficiency     PAST SURGICAL HISTORY: Past Surgical History:  Procedure Laterality Date   COLONOSCOPY WITH PROPOFOL  2014   PROSTATE BIOPSY     SKIN LESION EXCISION     on top of foot    FAMILY HISTORY: Family History  Problem Relation Age of Onset   Cancer Father 41       metastatic prostate   Heart disease Father    Cancer Sister        colon, uterine   Cancer Brother        prostate   Cancer Sister        breast    SOCIAL HISTORY: Social History   Socioeconomic History   Marital status: Married    Spouse name: Almyra Free   Number of children: 4   Years of education: Not on file   Highest education level: Bachelor's degree (e.g., BA, AB, BS)  Occupational History    Comment: working full time  Scientist, product/process development strain: Not on file   Food insecurity    Worry: Not on file    Inability: Not on file  Transportation needs    Medical: Not on file    Non-medical: Not on file  Tobacco Use   Smoking status: Never Smoker   Smokeless tobacco: Never Used  Substance and Sexual Activity   Alcohol use: Never    Frequency: Never   Drug use: Never   Sexual activity: Yes  Lifestyle   Physical activity    Days per week: Not on file    Minutes per session: Not on file   Stress: Not on file  Relationships   Social connections    Talks on phone: Not on file    Gets together: Not on file    Attends religious service: Not on file    Active member of club or organization: Not on file    Attends meetings of clubs or organizations: Not on file    Relationship status: Not on file   Intimate partner violence    Fear of current or ex partner: Not on file    Emotionally abused: Not on file    Physically abused: Not on file    Forced sexual activity: Not on file  Other Topics Concern   Not on file  Social History  Narrative   Lives with wife   Caffeine- none     PHYSICAL EXAM  GENERAL EXAM/CONSTITUTIONAL: Vitals:  Vitals:   04/20/19 0839  BP: (!) 152/88  Pulse: 70  Temp: (!) 97.4 F (36.3 C)  Weight: (!) 312 lb (141.5 kg)  Height: 6' (1.829 m)     Body mass index is 42.31 kg/m. Wt Readings from Last 3 Encounters:  04/20/19 (!) 312 lb (141.5 kg)  04/01/19 (!) 312 lb (141.5 kg)  02/03/19 (!) 317 lb (143.8 kg)     Patient is in no distress; well developed, nourished and groomed; neck is supple  CARDIOVASCULAR:  Examination of carotid arteries is normal; no carotid bruits  Regular rate and rhythm, no murmurs  Examination of peripheral vascular system by observation and palpation is normal  EYES:  Ophthalmoscopic exam of optic discs and posterior segments is normal; no papilledema or hemorrhages  No exam data present  MUSCULOSKELETAL:  Gait, strength, tone, movements noted in Neurologic exam below  NEUROLOGIC: MENTAL STATUS:  No flowsheet data found.  awake, alert, oriented to person, place and time  recent and remote memory intact  normal attention and concentration  language fluent, comprehension intact, naming intact  fund of knowledge appropriate  CRANIAL NERVE:   2nd - no papilledema on fundoscopic exam  2nd, 3rd, 4th, 6th - pupils equal and reactive to light, visual fields full to confrontation, extraocular muscles intact, no nystagmus  5th - facial sensation symmetric  7th - facial strength symmetric  8th - hearing intact  9th - palate elevates symmetrically, uvula midline  11th - shoulder shrug symmetric  12th - tongue protrusion midline  MOTOR:   normal bulk and tone, full strength in the BUE, BLE  SENSORY:   normal and symmetric to light touch, pinprick, temperature, vibration; EXCEPT DECR IN TOES AND BOTTOM OF FEET DISTALLY  COORDINATION:   finger-nose-finger, fine finger movements normal  REFLEXES:   deep tendon reflexes  TRACE and symmetric  GAIT/STATION:   narrow based gait; romberg is negative     DIAGNOSTIC DATA (LABS, IMAGING, TESTING) - I reviewed patient records, labs, notes, testing and imaging myself where available.  Lab Results  Component Value Date   WBC 8.7 04/01/2019   HGB 13.0 04/01/2019   HCT 39.4 04/01/2019  MCV 85 04/01/2019   PLT 372 04/01/2019      Component Value Date/Time   NA 138 04/01/2019 0850   K 4.7 04/01/2019 0850   CL 101 04/01/2019 0850   CO2 21 04/01/2019 0850   GLUCOSE 90 04/01/2019 0850   BUN 19 04/01/2019 0850   CREATININE 1.39 (H) 04/01/2019 0850   CALCIUM 10.1 04/01/2019 0850   PROT 7.5 04/01/2019 0850   ALBUMIN 5.0 (H) 04/01/2019 0850   AST 20 04/01/2019 0850   ALT 26 04/01/2019 0850   ALKPHOS 61 04/01/2019 0850   BILITOT 0.4 04/01/2019 0850   GFRNONAA 56 (L) 04/01/2019 0850   GFRAA 65 04/01/2019 0850   Lab Results  Component Value Date   CHOL 204 (H) 10/13/2018   HDL 44 10/13/2018   LDLCALC 127 (H) 10/13/2018   TRIG 167 (H) 10/13/2018   CHOLHDL 4.6 10/13/2018   No results found for: HGBA1C Lab Results  Component Value Date   VITAMINB12 786 10/02/2013   Lab Results  Component Value Date   TSH 3.580 10/13/2018       ASSESSMENT AND PLAN  58 y.o. year old male here with:  Dx:  1. Numbness and tingling     PLAN:  NEUROPATHY (numbness in feet) - check neuropathy labs; may consider EMG/NCS in future - continue pregabalin 237m at bedtime; continue essential oil and CBD oil  Orders Placed This Encounter  Procedures   Vitamin B12   Hemoglobin A1c   ANA,IFA RA Diag Pnl w/rflx Tit/Patn   Multiple Myeloma Panel (SPEP&IFE w/QIG)   Return pending test results, for pending if symptoms worsen or fail to improve.    VPenni Bombard MD 114/[id number]/1856 93:14AM Certified in Neurology, Neurophysiology and Neuroimaging  [facility] [address] [place] [phone]

## 2019-04-20 NOTE — Patient Instructions (Signed)
NEUROPATHY  - check neuropathy labs; may consider EMG/NCS in future - continue pregabalin 200mg  at bedtime; continue essential oil and CBD oil

## 2019-04-27 LAB — ANA,IFA RA DIAG PNL W/RFLX TIT/PATN
ANA Titer 1: NEGATIVE
Cyclic Citrullin Peptide Ab: 5 units (ref 0–19)
Rheumatoid fact SerPl-aCnc: 28.3 IU/mL — ABNORMAL HIGH (ref 0.0–13.9)

## 2019-04-27 LAB — MULTIPLE MYELOMA PANEL, SERUM
Albumin SerPl Elph-Mcnc: 4.1 g/dL (ref 2.9–4.4)
Albumin/Glob SerPl: 1.4 (ref 0.7–1.7)
Alpha 1: 0.2 g/dL (ref 0.0–0.4)
Alpha2 Glob SerPl Elph-Mcnc: 0.6 g/dL (ref 0.4–1.0)
B-Globulin SerPl Elph-Mcnc: 1.1 g/dL (ref 0.7–1.3)
Gamma Glob SerPl Elph-Mcnc: 1.2 g/dL (ref 0.4–1.8)
Globulin, Total: 3.1 g/dL (ref 2.2–3.9)
IgA/Immunoglobulin A, Serum: 265 mg/dL (ref 90–386)
IgG (Immunoglobin G), Serum: 1184 mg/dL (ref 603–1613)
IgM (Immunoglobulin M), Srm: 69 mg/dL (ref 20–172)
Total Protein: 7.2 g/dL (ref 6.0–8.5)

## 2019-04-27 LAB — HEMOGLOBIN A1C
Est. average glucose Bld gHb Est-mCnc: 103 mg/dL
Hgb A1c MFr Bld: 5.2 % (ref 4.8–5.6)

## 2019-04-27 LAB — VITAMIN B12: Vitamin B-12: 512 pg/mL (ref 232–1245)

## 2019-04-28 ENCOUNTER — Telehealth: Payer: Self-pay | Admitting: *Deleted

## 2019-04-28 DIAGNOSIS — M2550 Pain in unspecified joint: Secondary | ICD-10-CM

## 2019-05-05 ENCOUNTER — Telehealth: Payer: Self-pay

## 2019-05-05 NOTE — Telephone Encounter (Signed)
I had call pt about his lab work results. Pt wants Dr. Leta Baptist  to know his feet numbness and pan does increase at times. He is using some CBD oil to help with the pain and taking lyrica. HE wanted to know if his feet pain gets worse and unable to work should he apply for disability. He works for [employer]. He is just concern and wanted to get Dr.Penumalli advice. I stated message will be sent to him. Pt verbalized understanding.

## 2019-05-05 NOTE — Telephone Encounter (Signed)
I called pt that per Dr.Penumalli he does not have any recommendations regarding disability. PT verbalized understanding.

## 2019-05-05 NOTE — Telephone Encounter (Signed)
Notes recorded by Marval Regal, RN on 05/05/2019 at 2:20 PM EST  I called pt and notified him that all his labs are okay. I stated his RF lab is elevated. Pt reported having numbness in both feet. Also he has joint pain in his toes and feet. I stated Dr.Penumalli recommend a referral to rheumatology for the joint pain . Pt would like referral and is still taking the lyrica.  ------

## 2019-05-05 NOTE — Telephone Encounter (Signed)
-----   Message from Penni Bombard, MD sent at 05/04/2019  5:34 PM EST ----- Labs ok, but RF is elevated. If having significant joint pain could consider rheumatology evaluation. Otherwise continue current plan.

## 2019-05-05 NOTE — Telephone Encounter (Signed)
I do not have any recommendation re: disability. -VRP

## 2019-05-05 NOTE — Addendum Note (Signed)
Addended by: Andrey Spearman R on: 05/05/2019 03:06 PM   Modules accepted: Orders

## 2019-05-24 ENCOUNTER — Other Ambulatory Visit: Payer: Self-pay

## 2019-05-24 ENCOUNTER — Observation Stay (HOSPITAL_COMMUNITY)
Admission: EM | Admit: 2019-05-24 | Discharge: 2019-05-25 | Disposition: A | Discharge status: Home / Self Care | Payer: BC Managed Care – PPO | Attending: Family Medicine | Admitting: Family Medicine

## 2019-05-24 ENCOUNTER — Encounter (HOSPITAL_COMMUNITY): Payer: Self-pay | Admitting: Internal Medicine

## 2019-05-24 DIAGNOSIS — C61 Malignant neoplasm of prostate: Secondary | ICD-10-CM

## 2019-05-24 DIAGNOSIS — Z91013 Allergy to seafood: Secondary | ICD-10-CM | POA: Insufficient documentation

## 2019-05-24 DIAGNOSIS — Z6841 Body Mass Index (BMI) 40.0 and over, adult: Secondary | ICD-10-CM | POA: Insufficient documentation

## 2019-05-24 DIAGNOSIS — T783XXA Angioneurotic edema, initial encounter: Secondary | ICD-10-CM | POA: Diagnosis not present

## 2019-05-24 DIAGNOSIS — M1711 Unilateral primary osteoarthritis, right knee: Secondary | ICD-10-CM | POA: Insufficient documentation

## 2019-05-24 DIAGNOSIS — Z20828 Contact with and (suspected) exposure to other viral communicable diseases: Secondary | ICD-10-CM | POA: Diagnosis not present

## 2019-05-24 DIAGNOSIS — E785 Hyperlipidemia, unspecified: Secondary | ICD-10-CM | POA: Diagnosis not present

## 2019-05-24 DIAGNOSIS — X58XXXA Exposure to other specified factors, initial encounter: Secondary | ICD-10-CM | POA: Insufficient documentation

## 2019-05-24 DIAGNOSIS — G629 Polyneuropathy, unspecified: Secondary | ICD-10-CM

## 2019-05-24 DIAGNOSIS — G609 Hereditary and idiopathic neuropathy, unspecified: Secondary | ICD-10-CM | POA: Insufficient documentation

## 2019-05-24 DIAGNOSIS — I447 Left bundle-branch block, unspecified: Secondary | ICD-10-CM | POA: Insufficient documentation

## 2019-05-24 DIAGNOSIS — Z79899 Other long term (current) drug therapy: Secondary | ICD-10-CM | POA: Diagnosis not present

## 2019-05-24 LAB — CBC WITH DIFFERENTIAL/PLATELET
Abs Immature Granulocytes: 0.04 10*3/uL (ref 0.00–0.07)
Basophils Absolute: 0 10*3/uL (ref 0.0–0.1)
Basophils Relative: 0 %
Eosinophils Absolute: 0.2 10*3/uL (ref 0.0–0.5)
Eosinophils Relative: 1 %
HCT: 43.7 % (ref 39.0–52.0)
Hemoglobin: 13.7 g/dL (ref 13.0–17.0)
Immature Granulocytes: 0 %
Lymphocytes Relative: 17 %
Lymphs Abs: 1.8 10*3/uL (ref 0.7–4.0)
MCH: 28.2 pg (ref 26.0–34.0)
MCHC: 31.4 g/dL (ref 30.0–36.0)
MCV: 89.9 fL (ref 80.0–100.0)
Monocytes Absolute: 0.6 10*3/uL (ref 0.1–1.0)
Monocytes Relative: 6 %
Neutro Abs: 8.2 10*3/uL — ABNORMAL HIGH (ref 1.7–7.7)
Neutrophils Relative %: 76 %
Platelets: 374 10*3/uL (ref 150–400)
RBC: 4.86 MIL/uL (ref 4.22–5.81)
RDW: 12.1 % (ref 11.5–15.5)
WBC: 10.7 10*3/uL — ABNORMAL HIGH (ref 4.0–10.5)
nRBC: 0 % (ref 0.0–0.2)

## 2019-05-24 LAB — CBC
HCT: 43.3 % (ref 39.0–52.0)
Hemoglobin: 13.2 g/dL (ref 13.0–17.0)
MCH: 27.5 pg (ref 26.0–34.0)
MCHC: 30.5 g/dL (ref 30.0–36.0)
MCV: 90.2 fL (ref 80.0–100.0)
Platelets: 406 10*3/uL — ABNORMAL HIGH (ref 150–400)
RBC: 4.8 MIL/uL (ref 4.22–5.81)
RDW: 12.2 % (ref 11.5–15.5)
WBC: 13.9 10*3/uL — ABNORMAL HIGH (ref 4.0–10.5)
nRBC: 0 % (ref 0.0–0.2)

## 2019-05-24 LAB — BASIC METABOLIC PANEL
Anion gap: 10 (ref 5–15)
BUN: 19 mg/dL (ref 6–20)
CO2: 24 mmol/L (ref 22–32)
Calcium: 9.5 mg/dL (ref 8.9–10.3)
Chloride: 103 mmol/L (ref 98–111)
Creatinine, Ser: 1.26 mg/dL — ABNORMAL HIGH (ref 0.61–1.24)
GFR calc Af Amer: >60 mL/min (ref >60)
GFR calc non Af Amer: >60 mL/min (ref >60)
Glucose, Bld: 102 mg/dL — ABNORMAL HIGH (ref 70–99)
Potassium: 4.4 mmol/L (ref 3.5–5.1)
Sodium: 137 mmol/L (ref 135–145)

## 2019-05-24 LAB — CREATININE, SERUM
Creatinine, Ser: 1.28 mg/dL — ABNORMAL HIGH (ref 0.61–1.24)
GFR calc Af Amer: >60 mL/min (ref >60)
GFR calc non Af Amer: >60 mL/min (ref >60)

## 2019-05-24 LAB — TYPE AND SCREEN
ABO/RH(D): A POS
Antibody Screen: NEGATIVE

## 2019-05-24 LAB — HIV ANTIBODY (ROUTINE TESTING W REFLEX): HIV Screen 4th Generation wRfx: NONREACTIVE

## 2019-05-24 LAB — ABO/RH: ABO/RH(D): A POS

## 2019-05-24 LAB — SEDIMENTATION RATE: Sed Rate: 3 mm/hr (ref 0–16)

## 2019-05-24 LAB — SARS CORONAVIRUS 2 (TAT 6-24 HRS): SARS Coronavirus 2: NEGATIVE

## 2019-05-24 LAB — C-REACTIVE PROTEIN: CRP: 0.8 mg/dL (ref <1.0)

## 2019-05-24 MED ORDER — ONDANSETRON HCL 4 MG PO TABS: 4.0000 mg | ORAL_TABLET | Freq: Four times a day (QID) | ORAL | Status: DC | PRN

## 2019-05-24 MED ORDER — DEXAMETHASONE SODIUM PHOSPHATE 10 MG/ML IJ SOLN
10.0000 mg | Freq: Once | INTRAMUSCULAR | Status: AC
  Administered 2019-05-24: 10 mg via INTRAVENOUS
  Filled 2019-05-24: qty 1

## 2019-05-24 MED ORDER — ACETAMINOPHEN 650 MG RE SUPP: 650.0000 mg | Freq: Four times a day (QID) | RECTAL | Status: DC | PRN

## 2019-05-24 MED ORDER — EPINEPHRINE 0.3 MG/0.3ML IJ SOAJ
0.3000 mg | Freq: Once | INTRAMUSCULAR | Status: AC
  Administered 2019-05-24: 0.3 mg via SUBCUTANEOUS
  Filled 2019-05-24: qty 0.3

## 2019-05-24 MED ORDER — METHYLPREDNISOLONE SODIUM SUCC 125 MG IJ SOLR
80.0000 mg | Freq: Two times a day (BID) | INTRAMUSCULAR | Status: DC
  Administered 2019-05-24 – 2019-05-25 (×2): 80 mg via INTRAVENOUS
  Filled 2019-05-24 (×2): qty 2

## 2019-05-24 MED ORDER — FAMOTIDINE IN NACL 20-0.9 MG/50ML-% IV SOLN
20.0000 mg | Freq: Two times a day (BID) | INTRAVENOUS | Status: DC
  Administered 2019-05-24 – 2019-05-25 (×2): 20 mg via INTRAVENOUS
  Filled 2019-05-24 (×2): qty 50

## 2019-05-24 MED ORDER — ACETAMINOPHEN 325 MG PO TABS: 650.0000 mg | ORAL_TABLET | Freq: Four times a day (QID) | ORAL | Status: DC | PRN

## 2019-05-24 MED ORDER — DIPHENHYDRAMINE HCL 50 MG/ML IJ SOLN: 50.0000 mg | Freq: Four times a day (QID) | INTRAMUSCULAR | Status: DC | PRN

## 2019-05-24 MED ORDER — ONDANSETRON HCL 4 MG/2ML IJ SOLN: 4.0000 mg | Freq: Four times a day (QID) | INTRAMUSCULAR | Status: DC | PRN

## 2019-05-24 MED ORDER — EPINEPHRINE 0.3 MG/0.3ML IJ SOAJ
0.3000 mg | Freq: Once | INTRAMUSCULAR | Status: AC
  Administered 2019-05-24: 0.3 mg via INTRAMUSCULAR
  Filled 2019-05-24: qty 0.3

## 2019-05-24 MED ORDER — FAMOTIDINE IN NACL 20-0.9 MG/50ML-% IV SOLN
20.0000 mg | Freq: Once | INTRAVENOUS | Status: AC
  Administered 2019-05-24: 20 mg via INTRAVENOUS
  Filled 2019-05-24: qty 50

## 2019-05-24 MED ORDER — ENOXAPARIN SODIUM 40 MG/0.4ML ~~LOC~~ SOLN
40.0000 mg | SUBCUTANEOUS | Status: DC
  Administered 2019-05-24: 40 mg via SUBCUTANEOUS
  Filled 2019-05-24: qty 0.4

## 2019-05-24 MED ORDER — PREGABALIN 100 MG PO CAPS
200.0000 mg | ORAL_CAPSULE | Freq: Every day | ORAL | Status: DC
  Administered 2019-05-24: 200 mg via ORAL
  Filled 2019-05-24: qty 2

## 2019-05-24 MED ORDER — PREGABALIN 100 MG PO CAPS: 200.0000 mg | ORAL_CAPSULE | Freq: Every day | ORAL | Status: DC | PRN

## 2019-05-24 NOTE — ED Provider Notes (Signed)
[facility] EMERGENCY DEPARTMENT Provider Note   CSN: [id number] Arrival date & time: 05/24/19  1347     History Chief Complaint  Patient presents with  . Oral Swelling    Scott David is a 58 y.o. male presenting for oral swelling.  Patient signed out to me by S of still, PA-C.  Please see previous notes for further history.  In brief, patient presenting for evaluation of facial and oral swelling which began this morning.  It began on the left side of his cheek, and spread to his lips and mouth.  He had 100 mg of Benadryl at 10:00, 4 ODT Claritin at 1230.  He had no improvement with either of these.  He has a history of allergy to shellfish, but no other allergies that he knows of.  Do not have any shellfish prior to arrival.  No history of angioedema.  He is not on lisinopril or other blood pressure medications.  He denies dental pain.  He denies fevers, chills, sore throat, cough, difficulty swallowing, feeling his throat is closing, tongue swelling, chest pain, shortness of breath, nausea, vomiting, abdominal pain.  HPI     Past Medical History:  Diagnosis Date  . At risk for sleep apnea    STOP-BANG=  4   SENT TO PCP 08-10-2013  . B12 deficiency   . Elevated PSA   . History of concussion    JAN 2013--  SLIGHT MEMORY ISSUES WHICH ARE IMPROVING  . Hyperlipidemia   . Hypogonadism male   . Idiopathic peripheral neuropathy   . PONV (postoperative nausea and vomiting)   . Prostate cancer ([place])   . Vitamin D deficiency     Patient Active Problem List   Diagnosis Date Noted  . Angioedema 05/24/2019  . Neuropathy   . Mass of right foot 03/03/2019  . Prostate cancer ([place]) 02/03/2019  . Primary osteoarthritis of right knee 10/13/2018  . Morbid obesity ([place]) 10/13/2018  . Hyperlipidemia     Past Surgical History:  Procedure Laterality Date  . COLONOSCOPY WITH PROPOFOL  2014  . PROSTATE BIOPSY    . SKIN LESION EXCISION     on top of foot        Family History  Problem Relation Age of Onset  . Cancer Father 36       metastatic prostate  . Heart disease Father   . Cancer Sister        colon, uterine  . Cancer Brother        prostate  . Cancer Sister        breast    Social History   Tobacco Use  . Smoking status: Never Smoker  . Smokeless tobacco: Never Used  Substance Use Topics  . Alcohol use: Never  . Drug use: Never    Home Medications Prior to Admission medications   Medication Sig Start Date End Date Taking? Authorizing Provider  acetaminophen (TYLENOL) 500 MG tablet Take 500 mg by mouth every 6 (six) hours as needed (pain).   Yes [provider]  Ascorbic Acid (VITAMIN C PO) Take 1 tablet by mouth daily.   Yes [provider]  b complex vitamins tablet Take 1 tablet by mouth daily.   Yes [provider]  Cholecalciferol (VITAMIN D3 PO) Take 1 tablet by mouth daily.   Yes [provider]  Cyanocobalamin (VITAMIN B-12 PO) Take 1 tablet by mouth daily.   Yes [provider]  diphenhydrAMINE (BENADRYL) 25  MG tablet Take 50-100 mg by mouth once as needed (allergic reactions).   Yes [provider]  ibuprofen (ADVIL) 200 MG tablet Take 800 mg by mouth every 6 (six) hours as needed (pain).   Yes [provider]  Omega-3 Fatty Acids (FISH OIL PO) Take 2 capsules by mouth daily.   Yes [provider]  OVER THE COUNTER MEDICATION Apply 1 application topically See admin instructions. CBD oil - rub onto feet daily at bedtime   Yes [provider]  OVER THE COUNTER MEDICATION Apply 1 application topically See admin instructions. Deep Relief Essential Oil - apply topically to feet every morning   Yes [provider]  OVER THE COUNTER MEDICATION Take 1 tablet by mouth daily. BLM (bones, ligaments, muscles) from [facility]   Yes [provider]  pregabalin (LYRICA) 50 MG capsule 1 qhs X7 days , then 2 qhs X 7d, then 3  qhs X 7d, then 4 qhs Patient taking differently: Take 100-200 mg by mouth See admin instructions. Take four capsules (200 mg) by mouth every evening at 5pm, may also take two capsules (100 mg) during the day as needed for neuropathy 04/01/19  Yes Claretta Fraise, MD    Allergies    Fish allergy, Shellfish allergy, and Iodine  Review of Systems   Review of Systems  HENT: Positive for facial swelling.   All other systems reviewed and are negative.   Physical Exam Updated Vital Signs BP (!) 110/54   Pulse 83   Temp 98.5 F (36.9 C) (Oral)   Resp 18   SpO2 98%   Physical Exam Vitals and nursing note reviewed.  Constitutional:      General: He is not in acute distress.    Appearance: He is well-developed.     Comments: Obese male resting comfortably in the bed in no acute distress  HENT:     Head: Normocephalic and atraumatic.     Comments: Angioedema present of upper and lower lips.  Mild bilateral cheek swelling. No obvious tongue swelling.  Halitosis noted.  No dental pain.  No tonsillar swelling or exudate.  Handling secretions easily.  Airway intact. Eyes:     Conjunctiva/sclera: Conjunctivae normal.     Pupils: Pupils are equal, round, and reactive to light.  Cardiovascular:     Rate and Rhythm: Normal rate and regular rhythm.     Pulses: Normal pulses.  Pulmonary:     Effort: Pulmonary effort is normal. No respiratory distress.     Breath sounds: Normal breath sounds. No wheezing.     Comments: Clear lung sounds in all fields Abdominal:     General: There is no distension.     Palpations: Abdomen is soft. There is no mass.     Tenderness: There is no abdominal tenderness. There is no guarding or rebound.  Musculoskeletal:        General: Normal range of motion.     Cervical back: Normal range of motion and neck supple.  Skin:    General: Skin is warm and dry.     Capillary Refill: Capillary refill takes less than 2 seconds.  Neurological:     Mental Status: He is  alert and oriented to person, place, and time.     ED Results / Procedures / Treatments   Labs (all labs ordered are listed, but only abnormal results are displayed) Labs Reviewed  CBC WITH DIFFERENTIAL/PLATELET - Abnormal; Notable for the following components:  Result Value   WBC 10.7 (*)    Neutro Abs 8.2 (*)    All other components within normal limits  BASIC METABOLIC PANEL - Abnormal; Notable for the following components:   Glucose, Bld 102 (*)    Creatinine, Ser 1.26 (*)    All other components within normal limits  CBC - Abnormal; Notable for the following components:   WBC 13.9 (*)    Platelets 406 (*)    All other components within normal limits  CREATININE, SERUM - Abnormal; Notable for the following components:   Creatinine, Ser 1.28 (*)    All other components within normal limits  SARS CORONAVIRUS 2 (TAT 6-24 HRS)  HIV ANTIBODY (ROUTINE TESTING W REFLEX)  BASIC METABOLIC PANEL  CBC  C-REACTIVE PROTEIN  SEDIMENTATION RATE  C4 COMPLEMENT  TYPE AND SCREEN  ABO/RH    EKG EKG Interpretation  Date/Time:  Sunday May 24 2019 14:05:47 EST Ventricular Rate:  81 PR Interval:    QRS Duration: 111 QT Interval:  382 QTC Calculation: 444 R Axis:   -26 Text Interpretation: Sinus rhythm Incomplete left bundle branch block Low voltage, precordial leads No old tracing to compare Confirmed by Aletta Edouard [phone]) on 05/24/2019 2:08:13 PM   Radiology No results found.  Procedures .Critical Care Performed by: Franchot Heidelberg, PA-C Authorized by: Franchot Heidelberg, PA-C   Critical care provider statement:    Critical care time (minutes):  35   Critical care time was exclusive of:  Separately billable procedures and treating other patients and teaching time   Critical care was necessary to treat or prevent imminent or life-threatening deterioration of the following conditions: angioedema.   Critical care was time spent personally by me on the following  activities:  Blood draw for specimens, development of treatment plan with patient or surrogate, evaluation of patient's response to treatment, examination of patient, obtaining history from patient or surrogate, ordering and performing treatments and interventions, ordering and review of laboratory studies, ordering and review of radiographic studies, pulse oximetry, re-evaluation of patient's condition and review of old charts   I assumed direction of critical care for this patient from another provider in my specialty: yes   Comments:     Patient presenting with angioedema needing multiple rounds of medication and frequent reassessment.  Required admission   (including critical care time)  Medications Ordered in ED Medications  enoxaparin (LOVENOX) injection 40 mg (40 mg Subcutaneous Given 05/24/19 1942)  acetaminophen (TYLENOL) tablet 650 mg (has no administration in time range)    Or  acetaminophen (TYLENOL) suppository 650 mg (has no administration in time range)  ondansetron (ZOFRAN) tablet 4 mg (has no administration in time range)    Or  ondansetron (ZOFRAN) injection 4 mg (has no administration in time range)  pregabalin (LYRICA) capsule 200 mg (has no administration in time range)  pregabalin (LYRICA) capsule 200 mg (has no administration in time range)  famotidine (PEPCID) IVPB 20 mg premix (has no administration in time range)  diphenhydrAMINE (BENADRYL) injection 50 mg (has no administration in time range)  methylPREDNISolone sodium succinate (SOLU-MEDROL) 125 mg/2 mL injection 80 mg (has no administration in time range)  dexamethasone (DECADRON) injection 10 mg (10 mg Intravenous Given 05/24/19 1424)  EPINEPHrine (EPI-PEN) injection 0.3 mg (0.3 mg Intramuscular Given 05/24/19 1426)  famotidine (PEPCID) IVPB 20 mg premix (0 mg Intravenous Stopped 05/24/19 1604)  EPINEPHrine (EPI-PEN) injection 0.3 mg (0.3 mg Subcutaneous Given 05/24/19 1751)    ED Course  I have reviewed  the  triage vital signs and the nursing notes.  Pertinent labs & imaging results that were available during my care of the patient were reviewed by me and considered in my medical decision making (see chart for details).  Clinical Course as of May 24 2039  Sun Dec 27, 270  3437 58 year old male here with lip swelling with been progressive since this morning.  No obvious precipitant.  No new medications.  Not on an ACE inhibitor.  No prior history of same.  He is get marked edema of his upper and lower lip and maybe a little bit of his tongue but otherwise posterior airway.  Getting steroids at the and already has Benadryl on board.  Will need a prolonged period of observation with a possible need for intubation.   [MB]    Clinical Course User Index [MB] Hayden Rasmussen, MD   MDM Rules/Calculators/A&P                       Patient presenting for evaluation of angioedema.  Physical exam shows patient who has a patent airway, but obviously swollen lips and facial swelling.  Received Decadron and epi.  Will also give Pepcid.  On reassessment, patient reports symptoms had improved shortly after getting the medication, but then worsened again.  Will give another dose of epinephrine and reassess.  On reassessment after epi, patient reports no improvement of angioedema.  While his airway remains intact, I am concerned about his continued swelling and possibility for worsening.  Will call for admission for observation.  Discussed with Dr. Doristine Bosworth from [facility] hospitalist service, patient to be admitted.  Final Clinical Impression(s) / ED Diagnoses Final diagnoses:  Angioedema, initial encounter    Rx / DC Orders ED Discharge Orders    None       Franchot Heidelberg, PA-C 05/24/19 2040    Little, Wenda Overland, MD 05/24/19 2044

## 2019-05-24 NOTE — ED Notes (Signed)
PT has angioedema from unknown allergan. Allergic to shellfish but did not eat any. NP at bedside.

## 2019-05-24 NOTE — ED Notes (Signed)
ED TO INPATIENT HANDOFF REPORT  ED Nurse Name and Phone #: Scott David [phone]  S Name/Age/Gender Scott David 58 y.o. male Room/Bed: 019C/019C  Code Status   Code Status: Full Code  Home/SNF/Other Home Patient oriented to: self, place, time and situation Is this baseline? Yes   Triage Complete: Triage complete  Chief Complaint Angioedema [T78.3XXA]  Triage Note Pt here POV for facial and oral swelling onset this morning. Swelling in R neck, spreading to lips and mouth on arrival. 100 mg Benadryl this morning without relief, no distress, denies difficulty breathing.     Allergies Allergies  Allergen Reactions  . Fish Allergy Itching and Swelling    Feet swell (pt can eat flounder)  . Shellfish Allergy Shortness Of Breath and Swelling    Feet swelling  . Iodine Swelling    Feet swelling    Level of Care/Admitting Diagnosis ED Disposition    ED Disposition Condition Comment   Admit  Hospital Area: [facility] [[id number]]  Level of Care: Med-Surg [16]  I expect the patient will be discharged within 24 hours: Yes  LOW acuity---Tx typically complete <24 hrs---ACUTE conditions typically can be evaluated <24 hours---LABS likely to return to acceptable levels <24 hours---IS near functional baseline---EXPECTED to return to current living arrangement---NOT newly hypoxic: Meets criteria for 5C-Observation unit  Covid Evaluation: Asymptomatic Screening Protocol (No Symptoms)  Diagnosis: Angioedema [id number]  Admitting Physician: Scott David [id number]  Attending Physician: Scott David [phone]  PT Class (Do Not Modify): Observation [104]  PT Acc Code (Do Not Modify): Observation [10022]       B Medical/Surgery History Past Medical History:  Diagnosis Date  . At risk for sleep apnea    STOP-BANG=  4   SENT TO PCP 08-10-2013  . B12 deficiency   . Elevated PSA   . History of concussion    JAN 2013--  SLIGHT MEMORY ISSUES WHICH ARE IMPROVING  .  Hyperlipidemia   . Hypogonadism male   . Idiopathic peripheral neuropathy   . PONV (postoperative nausea and vomiting)   . Prostate cancer ([place])   . Vitamin D deficiency    Past Surgical History:  Procedure Laterality Date  . COLONOSCOPY WITH PROPOFOL  2014  . PROSTATE BIOPSY    . SKIN LESION EXCISION     on top of foot     A IV Location/Drains/Wounds Patient Lines/Drains/Airways Status   Active Line/Drains/Airways    Name:   Placement date:   Placement time:   Site:   Days:   Peripheral IV 05/24/19 Left Antecubital   05/24/19    1408    Antecubital   less than 1          Intake/Output Last 24 hours  Intake/Output Summary (Last 24 hours) at 05/24/2019 2004 Last data filed at 05/24/2019 1604 Gross per 24 hour  Intake 50 ml  Output --  Net 50 ml    Labs/Imaging Results for orders placed or performed during the hospital encounter of 05/24/19 (from the past 48 hour(s))  CBC with Differential     Status: Abnormal   Collection Time: 05/24/19  2:11 PM  Result Value Ref Range   WBC 10.7 (H) 4.0 - 10.5 K/uL   RBC 4.86 4.22 - 5.81 MIL/uL   Hemoglobin 13.7 13.0 - 17.0 g/dL   HCT 43.7 39.0 - 52.0 %   MCV 89.9 80.0 - 100.0 fL   MCH 28.2 26.0 - 34.0 pg   MCHC 31.4 30.0 -  36.0 g/dL   RDW 12.1 11.5 - 15.5 %   Platelets 374 150 - 400 K/uL   nRBC 0.0 0.0 - 0.2 %   Neutrophils Relative % 76 %   Neutro Abs 8.2 (H) 1.7 - 7.7 K/uL   Lymphocytes Relative 17 %   Lymphs Abs 1.8 0.7 - 4.0 K/uL   Monocytes Relative 6 %   Monocytes Absolute 0.6 0.1 - 1.0 K/uL   Eosinophils Relative 1 %   Eosinophils Absolute 0.2 0.0 - 0.5 K/uL   Basophils Relative 0 %   Basophils Absolute 0.0 0.0 - 0.1 K/uL   Immature Granulocytes 0 %   Abs Immature Granulocytes 0.04 0.00 - 0.07 K/uL    Comment: Performed at [facility] [address], [place], [place] [id number]  Basic metabolic panel     Status: Abnormal   Collection Time: 05/24/19  2:11 PM  Result Value Ref Range   Sodium 137 135 -  145 mmol/L   Potassium 4.4 3.5 - 5.1 mmol/L   Chloride 103 98 - 111 mmol/L   CO2 24 22 - 32 mmol/L   Glucose, Bld 102 (H) 70 - 99 mg/dL   BUN 19 6 - 20 mg/dL   Creatinine, Ser 1.26 (H) 0.61 - 1.24 mg/dL   Calcium 9.5 8.9 - 10.3 mg/dL   GFR calc non Af Amer >60 >60 mL/min   GFR calc Af Amer >60 >60 mL/min   Anion gap 10 5 - 15    Comment: Performed at [facility] [address], [place], [place] [address]  Type and screen Scott David     Status: None   Collection Time: 05/24/19  2:12 PM  Result Value Ref Range   ABO/RH(D) A POS    Antibody Screen NEG    Sample Expiration      05/27/2019,2359 Performed at [facility], [place] [address], [place], [place] [address]   ABO/Rh     Status: None   Collection Time: 05/24/19  2:12 PM  Result Value Ref Range   ABO/RH(D)      A POS Performed at [facility] [address] [place], [place] [address]    No results found.  Pending Labs Unresulted Labs (From admission, onward)    Start     Ordered   05/31/19 0500  Creatinine, serum  (enoxaparin (LOVENOX)    CrCl >/= 30 ml/min)  Weekly,   R    Comments: while on enoxaparin therapy    05/24/19 1920   05/25/19 [id number]  Basic metabolic panel  Tomorrow morning,   R     05/24/19 1920   05/25/19 0500  CBC  Tomorrow morning,   R     05/24/19 1920   05/24/19 1947  C-reactive protein  Add-on,   AD     05/24/19 1946   05/24/19 1947  Sedimentation rate  Add-on,   AD     05/24/19 1946   05/24/19 1947  C4 complement  Add-on,   AD     05/24/19 1946   05/24/19 1919  HIV Antibody (routine testing w rflx)  (HIV Antibody (Routine testing w reflex) panel)  Once,   STAT     05/24/19 1920   05/24/19 1919  CBC  (enoxaparin (LOVENOX)    CrCl >/= 30 ml/min)  Once,   STAT    Comments: Baseline for enoxaparin therapy IF NOT ALREADY DRAWN.  Notify MD if PLT < 100 K.  05/24/19 1920   05/24/19 1919  Creatinine, serum  (enoxaparin (LOVENOX)    CrCl >/= 30 ml/min)  Once,    STAT    Comments: Baseline for enoxaparin therapy IF NOT ALREADY DRAWN.    05/24/19 1920   05/24/19 1701  SARS CORONAVIRUS 2 (TAT 6-24 HRS) Nasopharyngeal Nasopharyngeal Swab  (Tier 3 (TAT 6-24 hrs))  Once,   STAT    Question Answer Comment  Is this test for diagnosis or screening Screening   Symptomatic for COVID-19 as defined by CDC No   Hospitalized for COVID-19 No   Admitted to ICU for COVID-19 No   Previously tested for COVID-19 No   Resident in a congregate (group) care setting No   Employed in healthcare setting No      05/24/19 1700          Vitals/Pain Today's Vitals   05/24/19 1730 05/24/19 1800 05/24/19 1830 05/24/19 1900  BP: 131/74 131/75 (!) 127/43 126/62  Pulse: 81 73 87 81  Resp: 16 17 18 16   Temp:      TempSrc:      SpO2: 96% 99% 99% 100%  PainSc:        Isolation Precautions No active isolations  Medications Medications  enoxaparin (LOVENOX) injection 40 mg (40 mg Subcutaneous Given 05/24/19 1942)  acetaminophen (TYLENOL) tablet 650 mg (has no administration in time range)    Or  acetaminophen (TYLENOL) suppository 650 mg (has no administration in time range)  ondansetron (ZOFRAN) tablet 4 mg (has no administration in time range)    Or  ondansetron (ZOFRAN) injection 4 mg (has no administration in time range)  pregabalin (LYRICA) capsule 200 mg (has no administration in time range)  pregabalin (LYRICA) capsule 200 mg (has no administration in time range)  famotidine (PEPCID) IVPB 20 mg premix (has no administration in time range)  diphenhydrAMINE (BENADRYL) injection 50 mg (has no administration in time range)  methylPREDNISolone sodium succinate (SOLU-MEDROL) 125 mg/2 mL injection 80 mg (has no administration in time range)  dexamethasone (DECADRON) injection 10 mg (10 mg Intravenous Given 05/24/19 1424)  EPINEPHrine (EPI-PEN) injection 0.3 mg (0.3 mg Intramuscular Given 05/24/19 1426)  famotidine (PEPCID) IVPB 20 mg premix (0 mg Intravenous  Stopped 05/24/19 1604)  EPINEPHrine (EPI-PEN) injection 0.3 mg (0.3 mg Subcutaneous Given 05/24/19 1751)    Mobility walks Low fall risk   Focused Assessments    R Recommendations: See Admitting Provider Note  Report given to:   Additional Notes:

## 2019-05-24 NOTE — ED Provider Notes (Signed)
[facility] EMERGENCY DEPARTMENT Provider Note   CSN: [id number] Arrival date & time: 05/24/19  1347     History Chief Complaint  Patient presents with  . Oral Swelling    Scott David is a 58 y.o. male.  Patient to ED with facial swelling that started on the left side of his face and progressed to marked upper and lower lip swelling. No difficulty swallowing. No SOB, wheezing or cough. He does not take an ACE inhibitor. No previous similar symptoms He took 100 mg Benadryl at the onset of symptoms without relief or improvement. He reports itching in hands and feet mostly, but without rash. No new medications. He has an allergy to seafood but denies recent ingestion.  The history is provided by the patient and the spouse. No language interpreter was used.       Past Medical History:  Diagnosis Date  . At risk for sleep apnea    STOP-BANG=  4   SENT TO PCP 08-10-2013  . B12 deficiency   . Elevated PSA   . History of concussion    JAN 2013--  SLIGHT MEMORY ISSUES WHICH ARE IMPROVING  . Hyperlipidemia   . Hypogonadism male   . Idiopathic peripheral neuropathy   . PONV (postoperative nausea and vomiting)   . Prostate cancer ([place])   . Vitamin D deficiency     Patient Active Problem List   Diagnosis Date Noted  . Mass of right foot 03/03/2019  . Malignant neoplasm of prostate ([place]) 02/03/2019  . Primary osteoarthritis of right knee 10/13/2018  . Morbid obesity ([place]) 10/13/2018  . Hyperlipidemia     Past Surgical History:  Procedure Laterality Date  . COLONOSCOPY WITH PROPOFOL  2014  . PROSTATE BIOPSY    . SKIN LESION EXCISION     on top of foot       Family History  Problem Relation Age of Onset  . Cancer Father 40       metastatic prostate  . Heart disease Father   . Cancer Sister        colon, uterine  . Cancer Brother        prostate  . Cancer Sister        breast    Social History   Tobacco Use  . Smoking status: Never Smoker    . Smokeless tobacco: Never Used  Substance Use Topics  . Alcohol use: Never  . Drug use: Never    Home Medications Prior to Admission medications   Medication Sig Start Date End Date Taking? Authorizing Provider  pregabalin (LYRICA) 50 MG capsule 1 qhs X7 days , then 2 qhs X 7d, then 3 qhs X 7d, then 4 qhs 04/01/19   Claretta Fraise, MD    Allergies    Fish allergy, Shellfish allergy, and Iodine  Review of Systems   Review of Systems  Constitutional: Negative for chills and fever.  HENT: Positive for facial swelling. Negative for trouble swallowing and voice change.   Respiratory: Negative.  Negative for shortness of breath.   Cardiovascular: Negative.  Negative for chest pain.  Gastrointestinal: Negative.  Negative for nausea.  Musculoskeletal: Negative.   Skin: Negative.   Neurological: Negative.     Physical Exam Updated Vital Signs BP (!) 143/85 (BP Location: Right Arm)   Pulse 79   Temp 98.5 F (36.9 C) (Oral)   Resp 14   SpO2 98%   Physical Exam Constitutional:      Appearance:  He is well-developed.  HENT:     Head: Normocephalic.     Comments: Angioedema of lips. No tongue swelling. Oropharynx is benign. Uvula midline. Cardiovascular:     Rate and Rhythm: Normal rate and regular rhythm.  Pulmonary:     Effort: Pulmonary effort is normal.     Breath sounds: Normal breath sounds. No wheezing, rhonchi or rales.  Abdominal:     General: Bowel sounds are normal.     Palpations: Abdomen is soft.     Tenderness: There is no abdominal tenderness. There is no guarding or rebound.  Musculoskeletal:        General: Normal range of motion.     Cervical back: Normal range of motion and neck supple.  Skin:    General: Skin is warm and dry.     Findings: No erythema or rash.  Neurological:     General: No focal deficit present.     Mental Status: He is alert and oriented to person, place, and time.     ED Results / Procedures / Treatments   Labs (all labs  ordered are listed, but only abnormal results are displayed) Labs Reviewed  CBC WITH DIFFERENTIAL/PLATELET  BASIC METABOLIC PANEL  TYPE AND SCREEN    EKG EKG Interpretation  Date/Time:  Sunday May 24 2019 14:05:47 EST Ventricular Rate:  81 PR Interval:    QRS Duration: 111 QT Interval:  382 QTC Calculation: 444 R Axis:   -26 Text Interpretation: Sinus rhythm Incomplete left bundle branch block Low voltage, precordial leads No old tracing to compare Confirmed by Aletta Edouard [phone]) on 05/24/2019 2:08:13 PM   Radiology No results found.  Procedures Procedures (including critical care time)  Medications Ordered in ED Medications  dexamethasone (DECADRON) injection 10 mg (has no administration in time range)  EPINEPHrine (EPI-PEN) injection 0.3 mg (has no administration in time range)    ED Course  I have reviewed the triage vital signs and the nursing notes.  Pertinent labs & imaging results that were available during my care of the patient were reviewed by me and considered in my medical decision making (see chart for details).  Clinical Course as of May 23 1417  Sun May 24, 3027  3281 58 year old male here with lip swelling with been progressive since this morning.  No obvious precipitant.  No new medications.  Not on an ACE inhibitor.  No prior history of same.  He is get marked edema of his upper and lower lip and maybe a little bit of his tongue but otherwise posterior airway.  Getting steroids at the and already has Benadryl on board.  Will need a prolonged period of observation with a possible need for intubation.   [MB]    Clinical Course User Index [MB] Hayden Rasmussen, MD   MDM Rules/Calculators/A&P                      Patient to ED with onset of facial swelling today without history of same or known precipitating event.  No trouble breathing or swallowing. No change with high dose Benadryl.  Patient given Decadron 10 mg, SQ Epi. He is stable, in  no respiratory distress and no airway compromise felt present currently.   Patient care signed out to [facility], PA-C, for reassessment and appropriate disposition.   Final Clinical Impression(s) / ED Diagnoses Final diagnoses:  None   1. Angioedema  Rx / DC Orders ED Discharge Orders    None  Charlann Lange, PA-C 05/25/19 2257    Hayden Rasmussen, MD 05/26/19 [phone]

## 2019-05-24 NOTE — H&P (Signed)
History and Physical    Scott David [id number] DOB: 08-Aug-1960 DOA: 05/24/2019  PCP: Scott Fraise, MD  Patient coming from: Home  I have personally briefly reviewed patient's old medical records in [place]  Chief Complaint: Swelling of lips and neck  HPI: Scott David is a 58 y.o. male with medical history significant of idiopathic peripheral neuropathy, osteoarthritis of right knee morbid obesity, recently diagnosed with prostate cancer presents to emergency department due to worsening swelling of lips and neck which started suddenly this morning.  Patient tells me that his swelling started on right side of his neck which started getting worse and involved his upper and lower lip and neck.  He took over-the-counter Benadryl with no help at all.  He denies any associated symptoms such as chest pain, shortness of breath, palpitation, choking sensation, nausea, vomiting, diarrhea, fever, chills, cough, congestion, generalized weakness, lethargy or decreased appetite.  He tells me that he is allergic to shellfish and iodine however he did not have any shellfish last night or this morning.  He ate trail mix nuts last night which is new for him.  No previous history of nuts allergy, no history of recent change in medication such as ACE inhibitor, food, detergent.  Does not have any history of new rash/urticaria or new joint pain.  He is recently diagnosed with prostate cancer in October 2020-his heme oncologist recommended prostatectomy/brachytherapy/chemotherapy however he refused and has been treating himself with over-the-counter medication.  He tells me that his PSA is improving.  ED Course: Upon arrival to ED: Patient vital signs stable.  CBC shows leukocytosis of 10.7, patient received 2 doses of epinephrine, Decadron and Pepcid in ED.    Review of Systems: As per HPI otherwise negative.    Past Medical History:  Diagnosis Date  . At risk for sleep apnea    STOP-BANG=   4   SENT TO PCP 08-10-2013  . B12 deficiency   . Elevated PSA   . History of concussion    JAN 2013--  SLIGHT MEMORY ISSUES WHICH ARE IMPROVING  . Hyperlipidemia   . Hypogonadism male   . Idiopathic peripheral neuropathy   . PONV (postoperative nausea and vomiting)   . Prostate cancer ([place])   . Vitamin D deficiency     Past Surgical History:  Procedure Laterality Date  . COLONOSCOPY WITH PROPOFOL  2014  . PROSTATE BIOPSY    . SKIN LESION EXCISION     on top of foot     reports that he has never smoked. He has never used smokeless tobacco. He reports that he does not drink alcohol or use drugs.  Allergies  Allergen Reactions  . Fish Allergy Itching and Swelling    Feet swell (pt can eat flounder)  . Shellfish Allergy Shortness Of Breath and Swelling    Feet swelling  . Iodine Swelling    Feet swelling    Family History  Problem Relation Age of Onset  . Cancer Father 43       metastatic prostate  . Heart disease Father   . Cancer Sister        colon, uterine  . Cancer Brother        prostate  . Cancer Sister        breast    Prior to Admission medications   Medication Sig Start Date End Date Taking? Authorizing Provider  acetaminophen (TYLENOL) 500 MG tablet Take 500 mg by mouth every 6 (six) hours  as needed (pain).   Yes [provider]  Ascorbic Acid (VITAMIN C PO) Take 1 tablet by mouth daily.   Yes [provider]  b complex vitamins tablet Take 1 tablet by mouth daily.   Yes [provider]  Cholecalciferol (VITAMIN D3 PO) Take 1 tablet by mouth daily.   Yes [provider]  Cyanocobalamin (VITAMIN B-12 PO) Take 1 tablet by mouth daily.   Yes [provider]  diphenhydrAMINE (BENADRYL) 25 MG tablet Take 50-100 mg by mouth once as needed (allergic reactions).   Yes [provider]  ibuprofen (ADVIL) 200 MG tablet Take 800 mg by mouth every 6 (six) hours as needed (pain).   Yes [provider]    Omega-3 Fatty Acids (FISH OIL PO) Take 2 capsules by mouth daily.   Yes [provider]  OVER THE COUNTER MEDICATION Apply 1 application topically See admin instructions. CBD oil - rub onto feet daily at bedtime   Yes [provider]  OVER THE COUNTER MEDICATION Apply 1 application topically See admin instructions. Deep Relief Essential Oil - apply topically to feet every morning   Yes [provider]  OVER THE COUNTER MEDICATION Take 1 tablet by mouth daily. BLM (bones, ligaments, muscles) from [facility]   Yes [provider]  pregabalin (LYRICA) 50 MG capsule 1 qhs X7 days , then 2 qhs X 7d, then 3 qhs X 7d, then 4 qhs Patient taking differently: Take 100-200 mg by mouth See admin instructions. Take four capsules (200 mg) by mouth every evening at 5pm, may also take two capsules (100 mg) during the day as needed for neuropathy 04/01/19  Yes Scott Fraise, MD    Physical Exam: Vitals:   05/24/19 1730 05/24/19 1800 05/24/19 1830 05/24/19 1900  BP: 131/74 131/75 (!) 127/43 126/62  Pulse: 81 73 87 81  Resp: '16 17 18 16  ' Temp:      TempSrc:      SpO2: 96% 99% 99% 100%    Constitutional: NAD, calm, comfortable, morbidly obese, on room air, communicating well. Eyes: PERRL, lids and conjunctivae normal ENMT: Mucous membranes are moist. Posterior pharynx clear of any exudate or lesions.Normal dentition.  Neck: normal, supple, no masses, no thyromegaly Respiratory: clear to auscultation bilaterally, no wheezing, no crackles. Normal respiratory effort. No accessory muscle use.  Cardiovascular: Regular rate and rhythm, no murmurs / rubs / gallops. No extremity edema. 2+ pedal pulses. No carotid bruits.  Abdomen: no tenderness, no masses palpated. No hepatosplenomegaly. Bowel sounds positive.  Musculoskeletal: no clubbing / cyanosis. No joint deformity upper and lower extremities. Good ROM, no contractures. Normal muscle tone.  Skin:    Neurologic: CN 2-12  grossly intact. Sensation intact, DTR normal. Strength 5/5 in all 4.  Psychiatric: Normal judgment and insight. Alert and oriented x 3. Normal mood.    Labs on Admission: I have personally reviewed following labs and imaging studies  CBC: Recent Labs  Lab 05/24/19 1411  WBC 10.7*  NEUTROABS 8.2*  HGB 13.7  HCT 43.7  MCV 89.9  PLT 847   Basic Metabolic Panel: Recent Labs  Lab 05/24/19 1411  NA 137  K 4.4  CL 103  CO2 24  GLUCOSE 102*  BUN 19  CREATININE 1.26*  CALCIUM 9.5   GFR: CrCl cannot be calculated (Unknown ideal weight.). Liver Function Tests: No results for input(s): AST, ALT, ALKPHOS, BILITOT, PROT, ALBUMIN in the last 168 hours. No results for input(s): LIPASE, AMYLASE in the  last 168 hours. No results for input(s): AMMONIA in the last 168 hours. Coagulation Profile: No results for input(s): INR, PROTIME in the last 168 hours. Cardiac Enzymes: No results for input(s): CKTOTAL, CKMB, CKMBINDEX, TROPONINI in the last 168 hours. BNP (last 3 results) No results for input(s): PROBNP in the last 8760 hours. HbA1C: No results for input(s): HGBA1C in the last 72 hours. CBG: No results for input(s): GLUCAP in the last 168 hours. Lipid Profile: No results for input(s): CHOL, HDL, LDLCALC, TRIG, CHOLHDL, LDLDIRECT in the last 72 hours. Thyroid Function Tests: No results for input(s): TSH, T4TOTAL, FREET4, T3FREE, THYROIDAB in the last 72 hours. Anemia Panel: No results for input(s): VITAMINB12, FOLATE, FERRITIN, TIBC, IRON, RETICCTPCT in the last 72 hours. Urine analysis:    Component Value Date/Time   BILIRUBINUR negative 09/13/2014 1745   PROTEINUR 1+ 09/13/2014 1745   UROBILINOGEN negative 09/13/2014 1745   NITRITE negative 09/13/2014 1745   LEUKOCYTESUR Negative 09/13/2014 1745    Radiological Exams on Admission: No results found.  EKG: Normal sinus rhythm, LBBB.  No ST elevation or depression noted.  Assessment/Plan Principal Problem:    Angioedema Active Problems:   Morbid obesity ([place])   Prostate cancer ([place])   Neuropathy   Angioedema: -Unknown underlying etiology -He does not appear to have a new rash -He does demonstrate marked lip swelling without obvious airway compromise at this time.  He is communicating well without any respiratory distress.  Maintaining oxygen saturation on room air. -Patient was treated in the ER with Pepcid; epinephrine x2 and Decadron.   -Start on Pepcid, Benadryl and Solu-Medrol. -Chest x-ray and COVID-19 is pending.  We will check CRP, ESR and levels of complement protein C4. -Will observe him overnight in the hospital.  If her tongue edema is improving, she is likely appropriate for d/c tomorrow. -On continuous pulse ox, monitor vitals closely.  Idiopathic peripheral neuropathy: -Followed by neurology outpatient -Continue Lyrica.  Recently diagnosed with prostate cancer: -He takes over-the-counter medicine for prostate cancer.  He tells me that his PSA is improving and he is followed by his PCP.  Morbid obesity: -Discussed about dietary modification, exercise and weight loss.   DVT prophylaxis: TED/SCD/Lovenox Code Status: Full code Family Communication: Patient's wife present at bedside.  Plan of care discussed with patient and his wife in length and he verbalized understanding and agreed with it. Disposition Plan: Likely home tomorrow consults called: None Admission status: Observation   Mckinley Jewel MD [facility] Pager [phone]  If 7PM-7AM, please contact night-coverage [email or web address] Password [facility]  05/24/2019, 7:49 PM

## 2019-05-24 NOTE — ED Triage Notes (Signed)
Pt here POV for facial and oral swelling onset this morning. Swelling in R neck, spreading to lips and mouth on arrival. 100 mg Benadryl this morning without relief, no distress, denies difficulty breathing.

## 2019-05-25 DIAGNOSIS — T783XXA Angioneurotic edema, initial encounter: Principal | ICD-10-CM

## 2019-05-25 LAB — BASIC METABOLIC PANEL
Anion gap: 13 (ref 5–15)
BUN: 16 mg/dL (ref 6–20)
CO2: 23 mmol/L (ref 22–32)
Calcium: 10 mg/dL (ref 8.9–10.3)
Chloride: 102 mmol/L (ref 98–111)
Creatinine, Ser: 1.18 mg/dL (ref 0.61–1.24)
GFR calc Af Amer: >60 mL/min (ref >60)
GFR calc non Af Amer: >60 mL/min (ref >60)
Glucose, Bld: 146 mg/dL — ABNORMAL HIGH (ref 70–99)
Potassium: 4.7 mmol/L (ref 3.5–5.1)
Sodium: 138 mmol/L (ref 135–145)

## 2019-05-25 LAB — CBC
HCT: 44.7 % (ref 39.0–52.0)
Hemoglobin: 14 g/dL (ref 13.0–17.0)
MCH: 28 pg (ref 26.0–34.0)
MCHC: 31.3 g/dL (ref 30.0–36.0)
MCV: 89.4 fL (ref 80.0–100.0)
Platelets: 427 10*3/uL — ABNORMAL HIGH (ref 150–400)
RBC: 5 MIL/uL (ref 4.22–5.81)
RDW: 12.2 % (ref 11.5–15.5)
WBC: 11 10*3/uL — ABNORMAL HIGH (ref 4.0–10.5)
nRBC: 0 % (ref 0.0–0.2)

## 2019-05-25 MED ORDER — FAMOTIDINE 20 MG PO TABS: 20.0000 mg | ORAL_TABLET | Freq: Two times a day (BID) | ORAL | 1 refills | Status: DC

## 2019-05-25 MED ORDER — EPINEPHRINE 0.3 MG/0.3ML IJ SOAJ: 0.3000 mg | INTRAMUSCULAR | 0 refills | Status: DC | PRN

## 2019-05-25 MED ORDER — METHYLPREDNISOLONE 4 MG PO TBPK: ORAL_TABLET | ORAL | 0 refills | Status: DC

## 2019-05-25 NOTE — Discharge Summary (Signed)
Physician Discharge Summary  Scott David [id number] DOB: 06/07/1960 DOA: 05/24/2019  PCP: Claretta Fraise, MD  Admit date: 05/24/2019 Discharge date: 05/25/2019  Time spent: 45 minutes  Recommendations for Outpatient Follow-up:  1. Follow up with PCP 1-2 weeks for evaluation of symptoms   Discharge Diagnoses:  Principal Problem:   Angioedema Active Problems:   Morbid obesity ([place])   Prostate cancer ([place])   Neuropathy   Discharge Condition: stable  Diet recommendation: heart healthy  Filed Weights   05/24/19 2218  Weight: (!) 141.7 kg    History of present illness:  Scott David is a 58 y.o. male with medical history significant of idiopathic peripheral neuropathy, osteoarthritis of right knee morbid obesity, recently diagnosed with prostate cancer presented to emergency department 12/27 due to worsening swelling of lips and neck which started suddenly that morning.  Patient reported that his swelling started on right side of his neck which started getting worse and involved his upper and lower lip and neck.  He took over-the-counter Benadryl with no help at all.  He denied any associated symptoms such as chest pain, shortness of breath, palpitation, choking sensation, nausea, vomiting, diarrhea, fever, chills, cough, congestion, generalized weakness, lethargy or decreased appetite.  He reported he is allergic to shellfish and iodine however he did not have any shellfish.  He ate trail mix nuts  which is new for him.  No previous history of nuts allergy, no history of recent change in medication such as ACE inhibitor, food, detergent. No history of new rash/urticaria or new joint pain.  He  recently diagnosed with prostate cancer in October 2020-his heme oncologist recommended prostatectomy/brachytherapy/chemotherapy however he refused and has been treating himself with over-the-counter medication.  He tells me that his PSA is improving.  Hospital Course:  #1.   Angioedema.  Etiology uncertain.  No rash associated.  Much improved this morning.  No airway compromise no difficulty chewing or swallowing.  Oxygen saturation levels remained greater than 90% on room air.  He was provided with Pepcid epinephrine Solu-Medrol and Benadryl.  Chest x-ray negative COVID-19 negative.  C4 complement, sed rate, C-reactive protein all within the limits of normal.  He did have a mild leukocytosis likely reactive he was afebrile hemodynamically stable and nontoxic-appearing.  Will discharge with a Medrol 6-day pack, Pepcid, and an EpiPen.  Also  have instructed he follow-up with his primary care provider 2 to 3 weeks  #2.  Idiopathic peripheral neuropathy.  Stable at baseline   #3.  Recently diagnosed with prostate cancer.  He has refused recommendations of his oncologist and is taking over-the-counter medications.  #4.  Morbid obesity.  BMI 42.3.  Outpatient follow-up   Procedures: Consultations:    Discharge Exam: Vitals:   05/25/19 0654 05/25/19 0900  BP: 132/82 120/73  Pulse: 80 95  Resp: 18 16  Temp: 98.1 F (36.7 C) 98.2 F (36.8 C)  SpO2: 94% 96%    General: Awake alert no acute distress Cardiovascular: Regular rate and rhythm no murmur gallop or rub Respiratory: Normal effort breath sounds clear no wheeze no rhonchi H HEENT.  Face still slightly puffy but his lips are much less swollen.  No swelling of the tongue.  Speech is clear facial symmetry no difficulty chewing or swallowing no increased work of breathing  Discharge Instructions   Discharge Instructions    Call MD for:  difficulty breathing, headache or visual disturbances   Complete by: As directed    Call MD for:  hives   Complete by: As directed    Call MD for:  temperature >100.4   Complete by: As directed    Diet - low sodium heart healthy   Complete by: As directed    Discharge instructions   Complete by: As directed    Take medications as prescribed.  Follow up with PCP 2-3  weeks for evaluation of symptoms   Increase activity slowly   Complete by: As directed      Allergies as of 05/25/2019      Reactions   Fish Allergy Itching, Swelling   Feet swell (pt can eat flounder)   Shellfish Allergy Shortness Of Breath, Swelling   Feet swelling   Iodine Swelling   Feet swelling      Medication List    TAKE these medications   acetaminophen 500 MG tablet Commonly known as: TYLENOL Take 500 mg by mouth every 6 (six) hours as needed (pain).   b complex vitamins tablet Take 1 tablet by mouth daily.   diphenhydrAMINE 25 MG tablet Commonly known as: BENADRYL Take 50-100 mg by mouth once as needed (allergic reactions).   EPINEPHrine 0.3 mg/0.3 mL Soaj injection Commonly known as: EPI-PEN Inject 0.3 mLs (0.3 mg total) into the muscle as needed for anaphylaxis.   famotidine 20 MG tablet Commonly known as: PEPCID Take 1 tablet (20 mg total) by mouth 2 (two) times daily for 5 days. Start taking on: May 26, 2019   FISH OIL PO Take 2 capsules by mouth daily.   ibuprofen 200 MG tablet Commonly known as: ADVIL Take 800 mg by mouth every 6 (six) hours as needed (pain).   methylPREDNISolone 4 MG Tbpk tablet Commonly known as: MEDROL DOSEPAK Take as directed on dose pack   OVER THE COUNTER MEDICATION Apply 1 application topically See admin instructions. CBD oil - rub onto feet daily at bedtime   OVER THE COUNTER MEDICATION Apply 1 application topically See admin instructions. Deep Relief Essential Oil - apply topically to feet every morning   OVER THE COUNTER MEDICATION Take 1 tablet by mouth daily. BLM (bones, ligaments, muscles) from [facility]   pregabalin 50 MG capsule Commonly known as: Lyrica 1 qhs X7 days , then 2 qhs X 7d, then 3 qhs X 7d, then 4 qhs What changed:   how much to take  how to take this  when to take this  additional instructions   VITAMIN B-12 PO Take 1 tablet by mouth daily.   VITAMIN C PO Take 1 tablet by  mouth daily.   VITAMIN D3 PO Take 1 tablet by mouth daily.      Allergies  Allergen Reactions  . Fish Allergy Itching and Swelling    Feet swell (pt can eat flounder)  . Shellfish Allergy Shortness Of Breath and Swelling    Feet swelling  . Iodine Swelling    Feet swelling      The results of significant diagnostics from this hospitalization (including imaging, microbiology, ancillary and laboratory) are listed below for reference.    Significant Diagnostic Studies: No results found.  Microbiology: Recent Results (from the past 240 hour(s))  SARS CORONAVIRUS 2 (TAT 6-24 HRS) Nasopharyngeal Nasopharyngeal Swab     Status: None   Collection Time: 05/24/19  5:52 PM   Specimen: Nasopharyngeal Swab  Result Value Ref Range Status   SARS Coronavirus 2 NEGATIVE NEGATIVE Final    Comment: (NOTE) SARS-CoV-2 target nucleic acids are NOT DETECTED. The SARS-CoV-2 RNA is generally detectable  in upper and lower respiratory specimens during the acute phase of infection. Negative results do not preclude SARS-CoV-2 infection, do not rule out co-infections with other pathogens, and should not be used as the sole basis for treatment or other patient management decisions. Negative results must be combined with clinical observations, patient history, and epidemiological information. The expected result is Negative. Fact Sheet for Patients: SugarRoll.be Fact Sheet for Healthcare Providers: https://www.woods-mathews.com/ This test is not yet approved or cleared by the [place] FDA and  has been authorized for detection and/or diagnosis of SARS-CoV-2 by FDA under an Emergency Use Authorization (EUA). This EUA will remain  in effect (meaning this test can be used) for the duration of the COVID-19 declaration under Section 56 4(b)(1) of the Act, 21 U.S.C. section 360bbb-3(b)(1), unless the authorization is terminated or revoked sooner. Performed  at [facility], [place] [address] [place], [place] [address]      Labs: Basic Metabolic Panel: Recent Labs  Lab 05/24/19 1411 05/24/19 1948 05/25/19 0434  NA 137  --  138  K 4.4  --  4.7  CL 103  --  102  CO2 24  --  23  GLUCOSE 102*  --  146*  BUN 19  --  16  CREATININE 1.26* 1.28* 1.18  CALCIUM 9.5  --  10.0   Liver Function Tests: No results for input(s): AST, ALT, ALKPHOS, BILITOT, PROT, ALBUMIN in the last 168 hours. No results for input(s): LIPASE, AMYLASE in the last 168 hours. No results for input(s): AMMONIA in the last 168 hours. CBC: Recent Labs  Lab 05/24/19 1411 05/24/19 1948 05/25/19 0434  WBC 10.7* 13.9* 11.0*  NEUTROABS 8.2*  --   --   HGB 13.7 13.2 14.0  HCT 43.7 43.3 44.7  MCV 89.9 90.2 89.4  PLT 374 406* 427*   Cardiac Enzymes: No results for input(s): CKTOTAL, CKMB, CKMBINDEX, TROPONINI in the last 168 hours. BNP: BNP (last 3 results) No results for input(s): BNP in the last 8760 hours.  ProBNP (last 3 results) No results for input(s): PROBNP in the last 8760 hours.  CBG: No results for input(s): GLUCAP in the last 168 hours.     SignedRadene Gunning NP [facility] 05/25/2019, 10:35 AM

## 2019-05-25 NOTE — Progress Notes (Signed)
Deniel Elie Confer to be discharged Home per MD order. Discussed prescriptions and follow up appointments with the patient. Prescriptions explained to patient; medication list explained in detail. Patient and his wife verbalized understanding.  Skin clean, dry and intact without evidence of skin break down, no evidence of skin tears noted. IV catheter discontinued intact. Site without signs and symptoms of complications. Dressing and pressure applied. Pt denies pain at the site currently. No complaints noted.  Patient free of lines, drains, and wounds.   An After Visit Summary (AVS) was printed and given to the patient. Patient escorted via wheelchair, and discharged home via private auto.  Amaryllis Dyke, RN

## 2019-05-26 LAB — C4 COMPLEMENT: Complement C4, Body Fluid: 25 mg/dL (ref 12–38)

## 2019-06-11 ENCOUNTER — Encounter: Payer: Self-pay | Admitting: Family Medicine

## 2019-07-31 ENCOUNTER — Other Ambulatory Visit: Payer: Self-pay

## 2019-08-03 ENCOUNTER — Encounter: Payer: Self-pay | Admitting: Family Medicine

## 2019-08-03 ENCOUNTER — Ambulatory Visit: Payer: BC Managed Care – PPO | Admitting: Family Medicine

## 2019-08-03 ENCOUNTER — Other Ambulatory Visit: Payer: Self-pay

## 2019-08-03 VITALS — BP 127/71 | HR 95 | Temp 96.9°F | Ht 72.0 in | Wt 310.1 lb

## 2019-08-03 DIAGNOSIS — G8929 Other chronic pain: Secondary | ICD-10-CM

## 2019-08-03 DIAGNOSIS — C61 Malignant neoplasm of prostate: Secondary | ICD-10-CM

## 2019-08-03 DIAGNOSIS — M5441 Lumbago with sciatica, right side: Secondary | ICD-10-CM | POA: Diagnosis not present

## 2019-08-03 NOTE — Patient Instructions (Signed)

## 2019-08-03 NOTE — Progress Notes (Signed)
Subjective:  Patient ID: Scott David, male    DOB: 1960-08-18  Age: 59 y.o. MRN: [id number]  CC: Medical Management of Chronic Issues (pt here today for PSA check)   HPI Detric G Ledon presents for low back pain syndrome.  He says all he has been doing better over the time she was in.  He had an episode recently that has worked its way out.  No pain currently.  Additionally he is treating himself with a alternative prostate cancer treatment.  He is here today for a PSA.  He is asymptomatic.  He did take the Lyrica for a time but unfortunately developed urinary frequency and urgency with urge incontinence and had to discontinue the medicine.  He tried cutting it back to 1 pill a day and that did not give pain relief so he discontinued it.  As result he is taking a vitamin supplement that seems to be helping with pain relief.  Depression screen [facility] 2/9 08/03/2019 04/01/2019 10/13/2018  Decreased Interest 0 0 0  Down, Depressed, Hopeless 0 0 0  PHQ - 2 Score 0 0 0    History Council has a past medical history of At risk for sleep apnea, B12 deficiency, Elevated PSA, History of concussion, Hyperlipidemia, Hypogonadism male, Idiopathic peripheral neuropathy, PONV (postoperative nausea and vomiting), Prostate cancer ([place]), and Vitamin D deficiency.   He has a past surgical history that includes Colonoscopy with propofol (2014); Prostate biopsy; and Skin lesion excision.   His family history includes Cancer in his brother, sister, and sister; Cancer (age of onset: 28) in his father; Heart disease in his father.He reports that he has never smoked. He has never used smokeless tobacco. He reports that he does not drink alcohol or use drugs.    ROS Review of Systems  Constitutional: Negative for fever.  Respiratory: Negative for shortness of breath.   Cardiovascular: Negative for chest pain.  Genitourinary: Positive for frequency.  Musculoskeletal: Positive for arthralgias and back pain.  Skin:  Negative for rash.    Objective:  BP 127/71   Pulse 95   Temp (!) 96.9 F (36.1 C) (Oral)   Ht 6' (1.829 m)   Wt (!) 310 lb 2 oz (140.7 kg)   BMI 42.06 kg/m   BP Readings from Last 3 Encounters:  08/03/19 127/71  05/25/19 120/73  04/20/19 (!) 152/88    Wt Readings from Last 3 Encounters:  08/03/19 (!) 310 lb 2 oz (140.7 kg)  05/24/19 (!) 312 lb 6.3 oz (141.7 kg)  04/20/19 (!) 312 lb (141.5 kg)     Physical Exam Vitals reviewed.  Constitutional:      Appearance: He is well-developed.  HENT:     Head: Normocephalic and atraumatic.     Right Ear: External ear normal.     Left Ear: External ear normal.     Mouth/Throat:     Pharynx: No oropharyngeal exudate or posterior oropharyngeal erythema.  Eyes:     Pupils: Pupils are equal, round, and reactive to light.  Cardiovascular:     Rate and Rhythm: Normal rate and regular rhythm.     Heart sounds: No murmur.  Pulmonary:     Effort: No respiratory distress.     Breath sounds: Normal breath sounds.  Musculoskeletal:     Cervical back: Normal range of motion and neck supple.  Neurological:     Mental Status: He is alert and oriented to person, place, and time.  Assessment & Plan:   Sakari was seen today for medical management of chronic issues.  Diagnoses and all orders for this visit:  Prostate cancer ([place]) -     PSA Total (Reflex To Free)  Chronic low back pain with right-sided sciatica, unspecified back pain laterality    Low back exercise sheet printed and given to the patient for him to repeat twice daily.  This is for treatment as well as for prevention.   I have discontinued Tasman G. Cudworth's pregabalin and methylPREDNISolone. I am also having him maintain his OVER THE COUNTER MEDICATION, OVER THE COUNTER MEDICATION, acetaminophen, ibuprofen, diphenhydrAMINE, Cholecalciferol (VITAMIN D3 PO), Ascorbic Acid (VITAMIN C PO), OVER THE COUNTER MEDICATION, Omega-3 Fatty Acids (FISH OIL PO), b complex  vitamins, Cyanocobalamin (VITAMIN B-12 PO), EPINEPHrine, famotidine, and VITAMIN E BLEND PO.  Allergies as of 08/03/2019      Reactions   Fish Allergy Itching, Swelling   Feet swell (pt can eat flounder)   Shellfish Allergy Shortness Of Breath, Swelling   Feet swelling   Iodine Swelling   Feet swelling      Medication List       Accurate as of August 03, 2019  5:10 PM. If you have any questions, ask your nurse or doctor.        STOP taking these medications   methylPREDNISolone 4 MG Tbpk tablet Commonly known as: MEDROL DOSEPAK Stopped by: Claretta Fraise, MD   pregabalin 50 MG capsule Commonly known as: Lyrica Stopped by: Claretta Fraise, MD     TAKE these medications   acetaminophen 500 MG tablet Commonly known as: TYLENOL Take 500 mg by mouth every 6 (six) hours as needed (pain).   b complex vitamins tablet Take 1 tablet by mouth daily.   diphenhydrAMINE 25 MG tablet Commonly known as: BENADRYL Take 50-100 mg by mouth once as needed (allergic reactions).   EPINEPHrine 0.3 mg/0.3 mL Soaj injection Commonly known as: EPI-PEN Inject 0.3 mLs (0.3 mg total) into the muscle as needed for anaphylaxis.   famotidine 20 MG tablet Commonly known as: PEPCID Take 1 tablet (20 mg total) by mouth 2 (two) times daily for 5 days.   FISH OIL PO Take 2 capsules by mouth daily.   ibuprofen 200 MG tablet Commonly known as: ADVIL Take 800 mg by mouth every 6 (six) hours as needed (pain).   OVER THE COUNTER MEDICATION Apply 1 application topically See admin instructions. CBD oil - rub onto feet daily at bedtime   OVER THE COUNTER MEDICATION Apply 1 application topically See admin instructions. Deep Relief Essential Oil - apply topically to feet every morning   OVER THE COUNTER MEDICATION Take 1 tablet by mouth daily. BLM (bones, ligaments, muscles) from [facility]   VITAMIN B-12 PO Take 1 tablet by mouth daily.   VITAMIN C PO Take 1 tablet by mouth daily.   VITAMIN D3 PO  Take 1 tablet by mouth daily.   VITAMIN E BLEND PO Take by mouth.        Follow-up: Return in about 3 months (around 11/03/2019).  Claretta Fraise, M.D.

## 2019-08-04 LAB — PSA TOTAL (REFLEX TO FREE): Prostate Specific Ag, Serum: 9.6 ng/mL — ABNORMAL HIGH (ref 0.0–4.0)

## 2019-08-04 LAB — FPSA% REFLEX
% FREE PSA: 7.4 %
PSA, FREE: 0.71 ng/mL

## 2019-09-08 ENCOUNTER — Telehealth: Payer: Self-pay | Admitting: *Deleted

## 2019-09-08 NOTE — Telephone Encounter (Signed)
Called patient to ask question, lvm for a return call 

## 2019-09-14 ENCOUNTER — Telehealth: Payer: Self-pay | Admitting: *Deleted

## 2019-09-14 NOTE — Telephone Encounter (Signed)
CALLED PATIENT TO UPDATE, SPOKE WITH PATIENT 

## 2019-09-15 ENCOUNTER — Other Ambulatory Visit: Payer: Self-pay | Admitting: Urology

## 2019-09-15 ENCOUNTER — Telehealth: Payer: Self-pay | Admitting: *Deleted

## 2019-09-15 NOTE — Telephone Encounter (Signed)
xxxx 

## 2019-09-15 NOTE — Telephone Encounter (Signed)
Called patient to inform of implant date, spoke with patient and he is aware of this date. 

## 2019-10-06 ENCOUNTER — Telehealth: Payer: Self-pay | Admitting: *Deleted

## 2019-10-06 NOTE — Telephone Encounter (Signed)
CALLED PATIENT TO REMND OF PRE-SEED APPTS. FOR 10-08-19, LVM FOR A RETURN CALL

## 2019-10-07 ENCOUNTER — Telehealth: Payer: Self-pay | Admitting: *Deleted

## 2019-10-07 NOTE — Telephone Encounter (Signed)
CALLED PATIENT TO REMIND OF PRE-SEED APPTS. AND CHEST X-RAY AND EKG FOR 10-08-19, SPOKE WITH PATIENT AND HE IS AWARE OF THESE APPTS.

## 2019-10-08 ENCOUNTER — Other Ambulatory Visit: Payer: Self-pay

## 2019-10-08 ENCOUNTER — Encounter (HOSPITAL_COMMUNITY): Payer: BC Managed Care – PPO

## 2019-10-08 ENCOUNTER — Ambulatory Visit
Admission: RE | Admit: 2019-10-08 | Discharge: 2019-10-08 | Disposition: A | Discharge status: Home / Self Care | Payer: BC Managed Care – PPO | Source: Ambulatory Visit | Attending: Radiation Oncology | Admitting: Radiation Oncology

## 2019-10-08 ENCOUNTER — Encounter: Payer: Self-pay | Admitting: Medical Oncology

## 2019-10-08 ENCOUNTER — Ambulatory Visit (HOSPITAL_COMMUNITY)
Admission: RE | Admit: 2019-10-08 | Discharge: 2019-10-08 | Disposition: A | Discharge status: Home / Self Care | Payer: BC Managed Care – PPO | Source: Ambulatory Visit | Attending: Urology | Admitting: Urology

## 2019-10-08 ENCOUNTER — Ambulatory Visit: Payer: BC Managed Care – PPO | Admitting: Urology

## 2019-10-08 ENCOUNTER — Encounter (HOSPITAL_COMMUNITY)
Admission: RE | Admit: 2019-10-08 | Discharge: 2019-10-08 | Disposition: A | Discharge status: Home / Self Care | Payer: BC Managed Care – PPO | Source: Ambulatory Visit | Attending: Urology | Admitting: Urology

## 2019-10-08 DIAGNOSIS — C61 Malignant neoplasm of prostate: Secondary | ICD-10-CM | POA: Insufficient documentation

## 2019-10-12 NOTE — Progress Notes (Signed)
  Radiation Oncology         [phone] ________________________________  Name: Scott David MRN: [id number]  Date: 10/08/2019  DOB: 1961/05/01  SIMULATION AND TREATMENT PLANNING NOTE PUBIC ARCH STUDY  [id number], Cletus Gash, MD  Claretta Fraise, MD  DIAGNOSIS: 59 y.o. gentleman with Stage T1c adenocarcinoma of the prostate with Gleason score of 3+4, and PSA of 5.96 Oncology History   No history exists.      ICD-10-CM   1. Prostate cancer ([place])  C61     COMPLEX SIMULATION:  The patient presented today for evaluation for possible prostate seed implant. He was brought to the radiation planning suite and placed supine on the CT couch. A 3-dimensional image study set was obtained in upload to the planning computer. There, on each axial slice, I contoured the prostate gland. Then, using three-dimensional radiation planning tools I reconstructed the prostate in view of the structures from the transperineal needle pathway to assess for possible pubic arch interference. In doing so, I did not appreciate any pubic arch interference. Also, the patient's prostate volume was estimated based on the drawn structure. The volume was 42 cc.  Given the pubic arch appearance and prostate volume, patient remains a good candidate to proceed with prostate seed implant. Today, he freely provided informed written consent to proceed.    PLAN: The patient will undergo prostate seed implant.   ________________________________  Sheral Apley. Tammi Klippel, M.D.

## 2019-10-13 ENCOUNTER — Telehealth: Payer: Self-pay | Admitting: *Deleted

## 2019-10-13 NOTE — Telephone Encounter (Signed)
CALLED PATIENT TO INFORM THAT IMPLANT AND SPACE OAR HAS BEEN MOVED TO 11-13-19 @ 1 PM, LVM FOR A RETURN CALL

## 2019-10-14 ENCOUNTER — Telehealth: Payer: Self-pay | Admitting: *Deleted

## 2019-10-14 NOTE — Telephone Encounter (Signed)
xxxxx 

## 2019-10-14 NOTE — Telephone Encounter (Signed)
Called patient to inform that implant has been moved to 11-13-19 @ 1 pm, spoke with patient and he is aware of his implant being moved

## 2019-10-29 ENCOUNTER — Other Ambulatory Visit: Payer: Self-pay | Admitting: Urology

## 2019-10-29 DIAGNOSIS — C61 Malignant neoplasm of prostate: Secondary | ICD-10-CM

## 2019-11-03 ENCOUNTER — Telehealth: Payer: Self-pay | Admitting: Family Medicine

## 2019-11-03 ENCOUNTER — Ambulatory Visit (INDEPENDENT_AMBULATORY_CARE_PROVIDER_SITE_OTHER): Payer: BC Managed Care – PPO | Admitting: Nurse Practitioner

## 2019-11-03 ENCOUNTER — Ambulatory Visit: Payer: BC Managed Care – PPO | Admitting: Nurse Practitioner

## 2019-11-03 DIAGNOSIS — Z91018 Allergy to other foods: Secondary | ICD-10-CM | POA: Diagnosis not present

## 2019-11-03 MED ORDER — EPINEPHRINE 0.3 MG/0.3ML IJ SOAJ: 0.3000 mg | INTRAMUSCULAR | 5 refills | Status: AC | PRN

## 2019-11-03 NOTE — Progress Notes (Signed)
   Virtual Visit via telephone Note Due to COVID-19 pandemic this visit was conducted virtually. This visit type was conducted due to national recommendations for restrictions regarding the COVID-19 Pandemic (e.g. social distancing, sheltering in place) in an effort to limit this patient's exposure and mitigate transmission in our community. All issues noted in this document were discussed and addressed.  A physical exam was not performed with this format.  I connected with Scott David on 11/03/19 at 12:15 by telephone and verified that I am speaking with the correct person using two identifiers. Scott David is currently located at home and no one is currently with him during visit. The provider, Mary-Margaret Hassell Done, FNP is located in their office at time of visit.  I discussed the limitations, risks, security and privacy concerns of performing an evaluation and management service by telephone and the availability of in person appointments. I also discussed with the patient that there may be a patient responsible charge related to this service. The patient expressed understanding and agreed to proceed.   History and Present Illness:   Chief Complaint: Allergies   HPI Pateint calls in c/o of frequent of allergic reaction where lip and tongue swell. He is only allergic to iodine and shell fish. He denies any problem with red meat. He thinks may be from lyrica.  Review of Systems  Constitutional: Negative for diaphoresis and weight loss.  Eyes: Negative for blurred vision, double vision and pain.  Respiratory: Negative for shortness of breath.   Cardiovascular: Negative for chest pain, palpitations, orthopnea and leg swelling.  Gastrointestinal: Negative for abdominal pain.  Skin: Negative for rash.  Neurological: Negative for dizziness, sensory change, loss of consciousness, weakness and headaches.  Endo/Heme/Allergies: Negative for polydipsia. Does not bruise/bleed easily.    Psychiatric/Behavioral: Negative for memory loss. The patient does not have insomnia.   All other systems reviewed and are negative.    Observations/Objective: Alert and oriented- answers all questions appropriately No distress    Assessment and Plan: Scott David in today with chief complaint of Allergies   1. Food allergy Care deep diary of episodes - Ambulatory referral to Allergy Meds ordered this encounter  Medications  . EPINEPHrine 0.3 mg/0.3 mL IJ SOAJ injection    Sig: Inject 0.3 mLs (0.3 mg total) into the muscle as needed for anaphylaxis.    Dispense:  2 each    Refill:  5    Order Specific Question:   Supervising Provider    Answer:   Caryl Pina A [[id number]]       Follow Up Instructions: prn    I discussed the assessment and treatment plan with the patient. The patient was provided an opportunity to ask questions and all were answered. The patient agreed with the plan and demonstrated an understanding of the instructions.   The patient was advised to call back or seek an in-person evaluation if the symptoms worsen or if the condition fails to improve as anticipated.  The above assessment and management plan was discussed with the patient. The patient verbalized understanding of and has agreed to the management plan. Patient is aware to call the clinic if symptoms persist or worsen. Patient is aware when to return to the clinic for a follow-up visit. Patient educated on when it is appropriate to go to the emergency department.   Time call ended:  12:25  I provided 10 minutes of non-face-to-face time during this encounter.    Mary-Margaret Hassell Done, FNP

## 2019-11-06 ENCOUNTER — Encounter (HOSPITAL_BASED_OUTPATIENT_CLINIC_OR_DEPARTMENT_OTHER): Payer: Self-pay | Admitting: Urology

## 2019-11-10 ENCOUNTER — Encounter (HOSPITAL_BASED_OUTPATIENT_CLINIC_OR_DEPARTMENT_OTHER): Payer: Self-pay | Admitting: Urology

## 2019-11-10 ENCOUNTER — Telehealth: Payer: Self-pay | Admitting: *Deleted

## 2019-11-10 ENCOUNTER — Other Ambulatory Visit: Payer: Self-pay

## 2019-11-10 NOTE — Telephone Encounter (Signed)
Called patient to remind of lab and covid testing for 11-11-19, lvm for a return call

## 2019-11-10 NOTE — Progress Notes (Signed)
Spoke w/ via phone for pre-op interview---patient Lab needs dos----none              Lab results------ekg 10-08-2019 epic, chest xray 10-08-2019 epic, has lab appt 11-11-2019 at 100 pm for cbc, cmet, pt, ptt COVID test ------11-11-2019 at 1400 Arrive at -------1100 am 11-13-2019 No food after midnight, clear liquids until 700 am then npo Medications to take morning of surgery -----none Diabetic medication -----n/a Patient Special Instructions -----fleets enema am of surgery Pre-Op special Istructions -----none Patient verbalized understanding of instructions that were given at this phone interview. Patient denies shortness of breath, chest pain, fever, cough a this phone interview.

## 2019-11-11 ENCOUNTER — Other Ambulatory Visit (HOSPITAL_COMMUNITY)
Admission: RE | Admit: 2019-11-11 | Discharge: 2019-11-11 | Disposition: A | Discharge status: Home / Self Care | Payer: BC Managed Care – PPO | Source: Ambulatory Visit | Attending: Urology | Admitting: Urology

## 2019-11-11 ENCOUNTER — Encounter (HOSPITAL_COMMUNITY)
Admission: RE | Admit: 2019-11-11 | Discharge: 2019-11-11 | Disposition: A | Discharge status: Home / Self Care | Payer: BC Managed Care – PPO | Source: Ambulatory Visit | Attending: Urology | Admitting: Urology

## 2019-11-11 DIAGNOSIS — Z20822 Contact with and (suspected) exposure to covid-19: Secondary | ICD-10-CM | POA: Insufficient documentation

## 2019-11-11 DIAGNOSIS — Z01812 Encounter for preprocedural laboratory examination: Secondary | ICD-10-CM | POA: Insufficient documentation

## 2019-11-11 LAB — COMPREHENSIVE METABOLIC PANEL
ALT: 19 U/L (ref 0–44)
AST: 18 U/L (ref 15–41)
Albumin: 4.8 g/dL (ref 3.5–5.0)
Alkaline Phosphatase: 51 U/L (ref 38–126)
Anion gap: 6 (ref 5–15)
BUN: 19 mg/dL (ref 6–20)
CO2: 27 mmol/L (ref 22–32)
Calcium: 9 mg/dL (ref 8.9–10.3)
Chloride: 104 mmol/L (ref 98–111)
Creatinine, Ser: 1.18 mg/dL (ref 0.61–1.24)
GFR calc Af Amer: >60 mL/min (ref >60)
GFR calc non Af Amer: >60 mL/min (ref >60)
Glucose, Bld: 93 mg/dL (ref 70–99)
Potassium: 3.9 mmol/L (ref 3.5–5.1)
Sodium: 137 mmol/L (ref 135–145)
Total Bilirubin: 0.8 mg/dL (ref 0.3–1.2)
Total Protein: 7.3 g/dL (ref 6.5–8.1)

## 2019-11-11 LAB — PROTIME-INR
INR: 1.1 (ref 0.8–1.2)
Prothrombin Time: 13.8 seconds (ref 11.4–15.2)

## 2019-11-11 LAB — SARS CORONAVIRUS 2 (TAT 6-24 HRS): SARS Coronavirus 2: NEGATIVE

## 2019-11-11 LAB — APTT: aPTT: 27 seconds (ref 24–36)

## 2019-11-11 LAB — CBC
HCT: 41.1 % (ref 39.0–52.0)
Hemoglobin: 12.6 g/dL — ABNORMAL LOW (ref 13.0–17.0)
MCH: 27.3 pg (ref 26.0–34.0)
MCHC: 30.7 g/dL (ref 30.0–36.0)
MCV: 89.2 fL (ref 80.0–100.0)
Platelets: 318 10*3/uL (ref 150–400)
RBC: 4.61 MIL/uL (ref 4.22–5.81)
RDW: 12.7 % (ref 11.5–15.5)
WBC: 6.3 10*3/uL (ref 4.0–10.5)
nRBC: 0 % (ref 0.0–0.2)

## 2019-11-12 ENCOUNTER — Telehealth: Payer: Self-pay | Admitting: *Deleted

## 2019-11-12 NOTE — Telephone Encounter (Signed)
CALLED PATIENT TO REMIND OF PROCEDURE FOR 11-13-19, SPOKE WITH PATIENT'S WIFE- JULIE AND SHE IS AWARE OF THIS PROCEDURE

## 2019-11-13 ENCOUNTER — Ambulatory Visit (HOSPITAL_COMMUNITY): Payer: BC Managed Care – PPO

## 2019-11-13 ENCOUNTER — Ambulatory Visit (HOSPITAL_BASED_OUTPATIENT_CLINIC_OR_DEPARTMENT_OTHER)
Admission: RE | Admit: 2019-11-13 | Discharge: 2019-11-13 | Disposition: A | Discharge status: Home / Self Care | Payer: BC Managed Care – PPO | Source: Other Acute Inpatient Hospital | Attending: Urology | Admitting: Urology

## 2019-11-13 ENCOUNTER — Ambulatory Visit (HOSPITAL_BASED_OUTPATIENT_CLINIC_OR_DEPARTMENT_OTHER): Payer: BC Managed Care – PPO | Admitting: Anesthesiology

## 2019-11-13 ENCOUNTER — Encounter (HOSPITAL_BASED_OUTPATIENT_CLINIC_OR_DEPARTMENT_OTHER)
Admission: RE | Disposition: A | Discharge status: Home / Self Care | Payer: Self-pay | Source: Other Acute Inpatient Hospital | Attending: Urology

## 2019-11-13 ENCOUNTER — Encounter (HOSPITAL_BASED_OUTPATIENT_CLINIC_OR_DEPARTMENT_OTHER): Payer: Self-pay | Admitting: Urology

## 2019-11-13 DIAGNOSIS — Z888 Allergy status to other drugs, medicaments and biological substances status: Secondary | ICD-10-CM | POA: Insufficient documentation

## 2019-11-13 DIAGNOSIS — Z91013 Allergy to seafood: Secondary | ICD-10-CM | POA: Diagnosis not present

## 2019-11-13 DIAGNOSIS — Z8042 Family history of malignant neoplasm of prostate: Secondary | ICD-10-CM | POA: Insufficient documentation

## 2019-11-13 DIAGNOSIS — C61 Malignant neoplasm of prostate: Secondary | ICD-10-CM | POA: Insufficient documentation

## 2019-11-13 DIAGNOSIS — M1990 Unspecified osteoarthritis, unspecified site: Secondary | ICD-10-CM | POA: Diagnosis not present

## 2019-11-13 HISTORY — PX: SPACE OAR INSTILLATION: SHX6769

## 2019-11-13 HISTORY — PX: RADIOACTIVE SEED IMPLANT: SHX5150

## 2019-11-13 HISTORY — PX: CYSTOSCOPY: SHX5120

## 2019-11-13 SURGERY — INSERTION, RADIATION SOURCE, PROSTATE
Anesthesia: General | Site: Rectum

## 2019-11-13 MED ORDER — ETOMIDATE 2 MG/ML IV SOLN
INTRAVENOUS | Status: DC | PRN
Start: 2019-11-13 — End: 2019-11-13
  Administered 2019-11-13: 16 mg via INTRAVENOUS
  Administered 2019-11-13: 4 mg via INTRAVENOUS

## 2019-11-13 MED ORDER — ROCURONIUM BROMIDE 10 MG/ML (PF) SYRINGE
PREFILLED_SYRINGE | INTRAVENOUS | Status: AC
  Filled 2019-11-13: qty 10

## 2019-11-13 MED ORDER — ONDANSETRON HCL 4 MG/2ML IJ SOLN
INTRAMUSCULAR | Status: DC | PRN
  Administered 2019-11-13: 4 mg via INTRAVENOUS

## 2019-11-13 MED ORDER — OXYBUTYNIN CHLORIDE 5 MG PO TABS: 5.0000 mg | ORAL_TABLET | Freq: Three times a day (TID) | ORAL | 1 refills | Status: AC | PRN

## 2019-11-13 MED ORDER — LIDOCAINE 2% (20 MG/ML) 5 ML SYRINGE
INTRAMUSCULAR | Status: DC | PRN
  Administered 2019-11-13: 100 mg via INTRAVENOUS

## 2019-11-13 MED ORDER — SUCCINYLCHOLINE CHLORIDE 200 MG/10ML IV SOSY
PREFILLED_SYRINGE | INTRAVENOUS | Status: AC
  Filled 2019-11-13: qty 10

## 2019-11-13 MED ORDER — TRAMADOL HCL 50 MG PO TABS: 50.0000 mg | ORAL_TABLET | Freq: Four times a day (QID) | ORAL | 0 refills | Status: AC | PRN

## 2019-11-13 MED ORDER — SODIUM CHLORIDE FLUSH 0.9 % IV SOLN
INTRAVENOUS | Status: DC | PRN
  Administered 2019-11-13: 10 mL via INTRAVENOUS

## 2019-11-13 MED ORDER — SODIUM CHLORIDE 0.9 % IR SOLN
Status: DC | PRN
  Administered 2019-11-13: 1000 mL via INTRAVESICAL

## 2019-11-13 MED ORDER — CIPROFLOXACIN IN D5W 400 MG/200ML IV SOLN
INTRAVENOUS | Status: AC
  Filled 2019-11-13: qty 200

## 2019-11-13 MED ORDER — DIPHENHYDRAMINE HCL 50 MG/ML IJ SOLN
INTRAMUSCULAR | Status: AC
  Filled 2019-11-13: qty 1

## 2019-11-13 MED ORDER — FENTANYL CITRATE (PF) 100 MCG/2ML IJ SOLN
INTRAMUSCULAR | Status: AC
  Filled 2019-11-13: qty 2

## 2019-11-13 MED ORDER — PHENYLEPHRINE 40 MCG/ML (10ML) SYRINGE FOR IV PUSH (FOR BLOOD PRESSURE SUPPORT)
PREFILLED_SYRINGE | INTRAVENOUS | Status: AC
  Filled 2019-11-13: qty 10

## 2019-11-13 MED ORDER — MIDAZOLAM HCL 5 MG/5ML IJ SOLN
INTRAMUSCULAR | Status: DC | PRN
  Administered 2019-11-13: 2 mg via INTRAVENOUS

## 2019-11-13 MED ORDER — CIPROFLOXACIN IN D5W 400 MG/200ML IV SOLN
400.0000 mg | INTRAVENOUS | Status: AC
  Administered 2019-11-13: 400 mg via INTRAVENOUS

## 2019-11-13 MED ORDER — DEXAMETHASONE SODIUM PHOSPHATE 10 MG/ML IJ SOLN
INTRAMUSCULAR | Status: AC
  Filled 2019-11-13: qty 1

## 2019-11-13 MED ORDER — SUGAMMADEX SODIUM 500 MG/5ML IV SOLN
INTRAVENOUS | Status: AC
  Filled 2019-11-13: qty 5

## 2019-11-13 MED ORDER — FENTANYL CITRATE (PF) 100 MCG/2ML IJ SOLN: 25.0000 ug | INTRAMUSCULAR | Status: DC | PRN

## 2019-11-13 MED ORDER — LIDOCAINE 2% (20 MG/ML) 5 ML SYRINGE
INTRAMUSCULAR | Status: AC
  Filled 2019-11-13: qty 5

## 2019-11-13 MED ORDER — SUCCINYLCHOLINE CHLORIDE 200 MG/10ML IV SOSY
PREFILLED_SYRINGE | INTRAVENOUS | Status: DC | PRN
  Administered 2019-11-13: 140 mg via INTRAVENOUS

## 2019-11-13 MED ORDER — FENTANYL CITRATE (PF) 100 MCG/2ML IJ SOLN
INTRAMUSCULAR | Status: DC | PRN
  Administered 2019-11-13 (×3): 50 ug via INTRAVENOUS

## 2019-11-13 MED ORDER — DIPHENHYDRAMINE HCL 12.5 MG/5ML PO ELIX
50.0000 mg | ORAL_SOLUTION | Freq: Once | ORAL | Status: AC
  Administered 2019-11-13: 25 mg via ORAL
  Filled 2019-11-13: qty 20

## 2019-11-13 MED ORDER — LACTATED RINGERS IV SOLN: INTRAVENOUS | Status: DC

## 2019-11-13 MED ORDER — DIPHENHYDRAMINE HCL 50 MG/ML IJ SOLN
12.5000 mg | Freq: Four times a day (QID) | INTRAMUSCULAR | Status: DC | PRN
  Administered 2019-11-13: 12.5 mg via INTRAVENOUS

## 2019-11-13 MED ORDER — DIPHENHYDRAMINE HCL 12.5 MG/5ML PO ELIX
ORAL_SOLUTION | ORAL | Status: AC
  Filled 2019-11-13: qty 15

## 2019-11-13 MED ORDER — ONDANSETRON HCL 4 MG/2ML IJ SOLN
INTRAMUSCULAR | Status: AC
  Filled 2019-11-13: qty 2

## 2019-11-13 MED ORDER — MIDAZOLAM HCL 2 MG/2ML IJ SOLN
INTRAMUSCULAR | Status: AC
  Filled 2019-11-13: qty 2

## 2019-11-13 MED ORDER — FLEET ENEMA 7-19 GM/118ML RE ENEM: 1.0000 | ENEMA | Freq: Once | RECTAL | Status: DC

## 2019-11-13 MED ORDER — DEXAMETHASONE SODIUM PHOSPHATE 4 MG/ML IJ SOLN
INTRAMUSCULAR | Status: DC | PRN
  Administered 2019-11-13: 10 mg via INTRAVENOUS

## 2019-11-13 MED ORDER — IOHEXOL 300 MG/ML  SOLN
INTRAMUSCULAR | Status: DC | PRN
  Administered 2019-11-13: 7 mL

## 2019-11-13 SURGICAL SUPPLY — 44 items
BAG DRN RND TRDRP ANRFLXCHMBR (UROLOGICAL SUPPLIES) ×3
BAG URINE DRAIN 2000ML AR STRL (UROLOGICAL SUPPLIES) ×5 IMPLANT
BLADE CLIPPER SENSICLIP SURGIC (BLADE) ×5 IMPLANT
CATH FOLEY 2WAY SLVR  5CC 16FR (CATHETERS) ×5
CATH FOLEY 2WAY SLVR 5CC 16FR (CATHETERS) ×3 IMPLANT
CATH ROBINSON RED A/P 16FR (CATHETERS) IMPLANT
CATH ROBINSON RED A/P 20FR (CATHETERS) ×5 IMPLANT
CLOTH BEACON ORANGE TIMEOUT ST (SAFETY) ×5 IMPLANT
CNTNR URN SCR LID CUP LEK RST (MISCELLANEOUS) ×6 IMPLANT
CONT SPEC 4OZ STRL OR WHT (MISCELLANEOUS) ×10
COVER BACK TABLE 60X90IN (DRAPES) ×5 IMPLANT
COVER MAYO STAND STRL (DRAPES) ×5 IMPLANT
DRSG TEGADERM 4X4.75 (GAUZE/BANDAGES/DRESSINGS) ×8 IMPLANT
DRSG TEGADERM 8X12 (GAUZE/BANDAGES/DRESSINGS) ×10 IMPLANT
GAUZE SPONGE 4X4 12PLY STRL (GAUZE/BANDAGES/DRESSINGS) ×2 IMPLANT
GLOVE BIO SURGEON STRL SZ 6.5 (GLOVE) ×2 IMPLANT
GLOVE BIO SURGEON STRL SZ7.5 (GLOVE) ×5 IMPLANT
GLOVE BIO SURGEON STRL SZ8 (GLOVE) IMPLANT
GLOVE BIO SURGEONS STRL SZ 6.5 (GLOVE) ×2
GLOVE BIOGEL PI IND STRL 6.5 (GLOVE) IMPLANT
GLOVE BIOGEL PI IND STRL 7.0 (GLOVE) IMPLANT
GLOVE BIOGEL PI IND STRL 7.5 (GLOVE) IMPLANT
GLOVE BIOGEL PI INDICATOR 6.5 (GLOVE) ×4
GLOVE BIOGEL PI INDICATOR 7.0 (GLOVE) ×4
GLOVE BIOGEL PI INDICATOR 7.5 (GLOVE) ×4
GLOVE SURG ORTHO 8.5 STRL (GLOVE) ×5 IMPLANT
GLOVE SURG SS PI 6.5 STRL IVOR (GLOVE) IMPLANT
GOWN STRL REUS W/TWL LRG LVL3 (GOWN DISPOSABLE) ×9 IMPLANT
GOWN STRL REUS W/TWL XL LVL3 (GOWN DISPOSABLE) ×5 IMPLANT
HOLDER FOLEY CATH W/STRAP (MISCELLANEOUS) IMPLANT
I-SEED AGX100 ×140 IMPLANT
IMPL SPACEOAR SYSTEM 10ML (Spacer) ×3 IMPLANT
IMPLANT SPACEOAR SYSTEM 10ML (Spacer) ×5 IMPLANT
IV NS 1000ML (IV SOLUTION) ×5
IV NS 1000ML BAXH (IV SOLUTION) ×3 IMPLANT
KIT TURNOVER CYSTO (KITS) ×5 IMPLANT
MARKER SKIN DUAL TIP RULER LAB (MISCELLANEOUS) ×5 IMPLANT
SURGILUBE 2OZ TUBE FLIPTOP (MISCELLANEOUS) ×5 IMPLANT
SUT BONE WAX W31G (SUTURE) IMPLANT
SYR 10ML LL (SYRINGE) ×5 IMPLANT
TOWEL OR 17X26 10 PK STRL BLUE (TOWEL DISPOSABLE) ×5 IMPLANT
TRAY CYSTO PACK (CUSTOM PROCEDURE TRAY) ×5 IMPLANT
UNDERPAD 30X30 (UNDERPADS AND DIAPERS) ×10 IMPLANT
WATER STERILE IRR 500ML POUR (IV SOLUTION) ×5 IMPLANT

## 2019-11-13 NOTE — Anesthesia Procedure Notes (Signed)
Procedure Name: Intubation Date/Time: 11/13/2019 1:23 PM Performed by: Eulas Post, Jerren Flinchbaugh W, CRNA Pre-anesthesia Checklist: Patient identified, Emergency Drugs available, Suction available and Patient being monitored Patient Re-evaluated:Patient Re-evaluated prior to induction Oxygen Delivery Method: Circle system utilized Preoxygenation: Pre-oxygenation with 100% oxygen Induction Type: IV induction Ventilation: Mask ventilation without difficulty Laryngoscope Size: Miller and 2 Grade View: Grade I Tube type: Oral Tube size: 7.5 mm Number of attempts: 1 Airway Equipment and Method: Stylet and Oral airway Placement Confirmation: ETT inserted through vocal cords under direct vision,  positive ETCO2 and breath sounds checked- equal and bilateral Secured at: 23 cm Tube secured with: Tape Dental Injury: Teeth and Oropharynx as per pre-operative assessment

## 2019-11-13 NOTE — Psychosocial Assessment (Signed)
Patient c/o  Tongue swelling.  Dr/ Conrad Benham was called to check patient  Dr. Conrad Cankton in to see patient.   Patient given 25 mg liquid benadryl per Dr. Conrad  OK for patient to leave in 10 min per Dr.[place] because patient already feels much better.Marland Kitchen

## 2019-11-13 NOTE — Progress Notes (Signed)
Tongue not swollen feels much better.   Ambulatory to car stated ready to go home..driven home by his wife.Marland Kitchen

## 2019-11-13 NOTE — Anesthesia Postprocedure Evaluation (Signed)
Anesthesia Post Note  Patient: Scott David  Procedure(s) Performed: RADIOACTIVE SEED IMPLANT/BRACHYTHERAPY IMPLANT/ CYSTOSCOPY (N/A Prostate) SPACE OAR INSTILLATION (N/A Rectum) CYSTOSCOPY FLEXIBLE (N/A Bladder)     Patient location during evaluation: PACU Anesthesia Type: General Level of consciousness: awake and alert Pain management: pain level controlled Vital Signs Assessment: post-procedure vital signs reviewed and stable Respiratory status: spontaneous breathing, nonlabored ventilation, respiratory function stable and patient connected to nasal cannula oxygen Cardiovascular status: blood pressure returned to baseline and stable Postop Assessment: no apparent nausea or vomiting Anesthetic complications: no   No complications documented.  Last Vitals:  Vitals:   11/13/19 1500 11/13/19 1515  BP: (!) 141/72 (!) 146/81  Pulse: 78 81  Resp: 11 (!) 22  Temp:    SpO2: 91% 97%    Last Pain:  Vitals:   11/13/19 1500  TempSrc:   PainSc: 0-No pain                 Shakevia Sarris

## 2019-11-13 NOTE — Op Note (Signed)
PATIENT:  [name]  PRE-OPERATIVE DIAGNOSIS:  Adenocarcinoma of the prostate  POST-OPERATIVE DIAGNOSIS:  Same  PROCEDURE:  1. I-125 radioactive seed implantation 2. Cystoscopy  3. Placement of SpaceOAR  SURGEON:  Ellison Hughs, MD  Radiation oncologist: Tyler Pita, MD  ANESTHESIA:  General  EBL:  Minimal  DRAINS: None  INDICATION: Scott David is a 59 year old male with T1c, Gleason 3+4 prostate adenocarcinoma.  The risk, benefits and alternatives of the above procedures were discussed in detail.  He voices understanding and wishes to proceed.  Description of procedure: After informed consent the patient was brought to the major OR, placed on the table and administered general anesthesia. He was then moved to the modified lithotomy position with his perineum perpendicular to the floor. His perineum and genitalia were then sterilely prepped. An official timeout was then performed. A 16 French Foley catheter was then placed in the bladder and filled with dilute contrast, a rectal tube was placed in the rectum and the transrectal ultrasound probe was placed in the rectum and affixed to the stand. He was then sterilely draped.  Real time ultrasonography was used along with the seed planning software. This was used to develop the seed plan including the number of needles as well as number of seeds required for complete and adequate coverage. Real-time ultrasonography was then used along with the previously developed plan and the Nucletron device to implant a total of 70 seeds using 19 needles. This proceeded without difficulty or complication.   I then proceeded with placement of SpaceOAR by introducing a needle with the bevel angled inferiorly approximately 2 cm superior to the anus. This was angled downward and under direct ultrasound was placed within the space between the prostatic capsule and rectum. This was confirmed with a small amount of sterile saline injected and this  was performed under direct ultrasound. I then attached the SpaceOAR to the needle and injected this in the space between the prostate and rectum with good placement noted.  A Foley catheter was then removed as well as the transrectal ultrasound probe and rectal probe. Flexible cystoscopy was then performed using the 16 French flexible scope which revealed a normal urethra throughout its length down to the sphincter which appeared intact. The prostatic urethra revealed bilobar hypertrophy but no evidence of obstruction, seeds, spacers or lesions. The bladder was then entered and fully and systematically inspected. The ureteral orifices were noted to be of normal configuration and position. The mucosa revealed no evidence of tumors. There were also no stones identified within the bladder. I noted no seeds or spacers on the floor of the bladder and retroflexion of the scope revealed no seeds protruding from the base of the prostate.  The cystoscope was then removed and the patient was awakened and taken to recovery room in stable and satisfactory condition. He tolerated procedure well and there were no intraoperative complications.  Plan:  D/c home

## 2019-11-13 NOTE — Anesthesia Preprocedure Evaluation (Addendum)
Anesthesia Evaluation  Patient identified by MRN, date of birth, ID band Patient awake    Reviewed: Allergy & Precautions, NPO status   History of Anesthesia Complications (+) PONV  Airway Mallampati: II  TM Distance: >3 FB     Dental   Pulmonary neg pulmonary ROS,    breath sounds clear to auscultation       Cardiovascular negative cardio ROS   Rhythm:Regular Rate:Normal     Neuro/Psych  Neuromuscular disease    GI/Hepatic negative GI ROS, Neg liver ROS,   Endo/Other  negative endocrine ROS  Renal/GU negative Renal ROS     Musculoskeletal  (+) Arthritis ,   Abdominal   Peds  Hematology   Anesthesia Other Findings   Reproductive/Obstetrics                             Anesthesia Physical Anesthesia Plan  ASA: II  Anesthesia Plan: General   Post-op Pain Management:    Induction: Intravenous  PONV Risk Score and Plan: 3 and Ondansetron, Dexamethasone and Midazolam  Airway Management Planned: LMA and Oral ETT  Additional Equipment:   Intra-op Plan:   Post-operative Plan: Extubation in OR  Informed Consent: I have reviewed the patients History and Physical, chart, labs and discussed the procedure including the risks, benefits and alternatives for the proposed anesthesia with the patient or authorized representative who has indicated his/her understanding and acceptance.     Dental advisory given  Plan Discussed with: CRNA and Anesthesiologist  Anesthesia Plan Comments:        Anesthesia Quick Evaluation

## 2019-11-13 NOTE — H&P (Signed)
Urology Preoperative H&P   Chief Complaint: Prostate cancer  History of Present Illness: Scott David is a 59 y.o. male with T1c, high-volume Gleason 3+4 prostate adenocarcinoma that was diagnosed on 12/25/2018.  After a prolonged deliberation on his treatment options, the patient has ultimately decided to proceed with brachytherapy as primary treatment of his prostate cancer.  He states that he is urinating without difficulty and denies interval UTIs, dysuria or hematuria.  Last PSA: 9.6 (07/2019), 5.96 (10/2018), 9.4 (09/2018)-- The patient was seen in 2015 for an elevated PSA in the 3.0 range and was scheduled for multiple prostate biopsies that he ultimately cancelled. Family history of prostate cancer involving his brother and father.  Biopsy Date: 12/25/18  TNM stage: t1c  Gleason score: 3+4=7  Left: 6 out of 6 cores positive  Right: 5 out of 6 cores postiive  Prostate volume: 42.7 cm^3   Past Medical History:  Diagnosis Date  . B12 deficiency   . History of concussion    no residual  . Hyperlipidemia   . Hypogonadism male   . Idiopathic peripheral neuropathy    feet  . PONV (postoperative nausea and vomiting)    propofol will not put to sleep, woke up slightly during colonscopy  . Prostate cancer [facility]) urologist--  dr Yancy Hascall;  oncologist-- dr Tammi Klippel   dx 07/ 2020  Stage T1c, Gleason, 3+4  . Vitamin D deficiency     Past Surgical History:  Procedure Laterality Date  . COLONOSCOPY WITH PROPOFOL  2014, 2019  . LOWER LEG SOFT TISSUE TUMOR EXCISION  03-21-2019   @[facility] --[place]   right foot  . PROSTATE BIOPSY    . SKIN LESION EXCISION     on top of foot    Allergies:  Allergies  Allergen Reactions  . Fish Allergy Itching and Swelling    Feet swell (pt can eat flounder and whiting)  Shrimp anaphylaxis  . Iodine Anaphylaxis and Swelling  . Shellfish Allergy Shortness Of Breath and Swelling    Feet swelling  . Propofol     Will not put pt to sleeps wake up during colonscopy     Family History  Problem Relation Age of Onset  . Cancer Father 62       metastatic prostate  . Heart disease Father   . Cancer Sister        colon, uterine  . Cancer Brother        prostate  . Cancer Sister        breast    Social History:  reports that he has never smoked. He has never used smokeless tobacco. He reports that he does not drink alcohol and does not use drugs.  ROS: A complete review of systems was performed.  All systems are negative except for pertinent findings as noted.  Physical Exam:  Vital signs in last 24 hours: Temp:  [98.4 F (36.9 C)] 98.4 F (36.9 C) (06/18 1049) Pulse Rate:  [63] 63 (06/18 1049) Resp:  [14] 14 (06/18 1049) BP: (120)/(82) 120/82 (06/18 1049) SpO2:  [99 %] 99 % (06/18 1049) Weight:  [135.6 kg] 135.6 kg (06/18 1049) Constitutional:  Alert and oriented, No acute distress Cardiovascular: Regular rate and rhythm, No JVD Respiratory: Normal respiratory effort, Lungs clear bilaterally GI: Abdomen is soft, nontender, nondistended, no abdominal masses GU: No CVA tenderness Lymphatic: No lymphadenopathy Neurologic: Grossly intact, no focal deficits Psychiatric: Normal mood and affect  Laboratory Data:  Recent Labs    11/11/19 1233  WBC 6.3  HGB 12.6*  HCT 41.1  PLT 318    Recent Labs    11/11/19 1233  NA 137  K 3.9  CL 104  GLUCOSE 93  BUN 19  CALCIUM 9.0  CREATININE 1.18     No results found for this or any previous visit (from the past 24 hour(s)). Recent Results (from the past 240 hour(s))  SARS CORONAVIRUS 2 (TAT 6-24 HRS) Nasopharyngeal Nasopharyngeal Swab     Status: None   Collection Time: 11/11/19 12:52 PM   Specimen: Nasopharyngeal Swab  Result Value Ref Range Status   SARS Coronavirus 2 NEGATIVE NEGATIVE Final    Comment: (NOTE) SARS-CoV-2 target nucleic acids are NOT DETECTED.  The SARS-CoV-2 RNA is generally detectable in upper and lower respiratory specimens during the acute phase of infection.  Negative results do not preclude SARS-CoV-2 infection, do not rule out co-infections with other pathogens, and should not be used as the sole basis for treatment or other patient management decisions. Negative results must be combined with clinical observations, patient history, and epidemiological information. The expected result is Negative.  Fact Sheet for Patients: SugarRoll.be  Fact Sheet for Healthcare Providers: https://www.woods-mathews.com/  This test is not yet approved or cleared by the [place] FDA and  has been authorized for detection and/or diagnosis of SARS-CoV-2 by FDA under an Emergency Use Authorization (EUA). This EUA will remain  in effect (meaning this test can be used) for the duration of the COVID-19 declaration under Se ction 564(b)(1) of the Act, 21 U.S.C. section 360bbb-3(b)(1), unless the authorization is terminated or revoked sooner.  Performed at [facility], [place] [address] [place], [place] [address]     Renal Function: Recent Labs    11/11/19 1233  CREATININE 1.18   Estimated Creatinine Clearance: 98.5 mL/min (by C-G formula based on SCr of 1.18 mg/dL).  Radiologic Imaging: No results found.  I independently reviewed the above imaging studies.  Assessment and Plan Scott David is a 59 y.o. male with T1c, high volume Gleason 3+4 prostate cancer  -The patient was counseled about the natural history of prostate cancer and the standard treatment options that are available for prostate cancer. It was explained to him how his age and life expectancy, clinical stage, Gleason score, and PSA affect his prognosis, the decision to proceed with additional staging studies, as well as how that information influences recommended treatment strategies. We discussed the roles for active surveillance, radiation therapy, surgical therapy, androgen deprivation, as well as ablative therapy options for the  treatment of prostate cancer as appropriate to his individual cancer situation. We discussed the risks and benefits of these options with regard to their impact on cancer control and also in terms of potential adverse events, complications, and impact on quality of life particularly related to urinary and sexual function.  The patient has decided to proceed with cystoscopy, brachytherapy seed and SpaceOAR placement as primary treatment of his risk prostate cancer.  The risks, benefits and alternatives of the aforementioned procedures was discussed in detail.  Risks include, bur are not limited to worsening LUTS, erectile dysfunction, rectal irritation, urethral stricture formation, fistula formation, cancer recurrence, MI, CVA, PE, DVT and the inherent risk of general anesthesia.  He voices understanding and wishes to proceed.     Ellison Hughs, MD 11/13/2019, 12:58 PM  [facility] Pager: [phone]

## 2019-11-13 NOTE — Anesthesia Procedure Notes (Signed)
Procedure Name: LMA Insertion Date/Time: 11/13/2019 1:16 PM Performed by: Eulas Post, Birttany Dechellis W, CRNA Pre-anesthesia Checklist: Patient identified, Emergency Drugs available, Suction available and Patient being monitored Patient Re-evaluated:Patient Re-evaluated prior to induction Oxygen Delivery Method: Circle system utilized Preoxygenation: Pre-oxygenation with 100% oxygen Induction Type: IV induction Ventilation: Mask ventilation without difficulty LMA: LMA inserted LMA Size: 5.0 Number of attempts: 1 Placement Confirmation: positive ETCO2 and breath sounds checked- equal and bilateral Tube secured with: Tape Dental Injury: Teeth and Oropharynx as per pre-operative assessment

## 2019-11-13 NOTE — Discharge Instructions (Signed)

## 2019-11-13 NOTE — Transfer of Care (Signed)
Immediate Anesthesia Transfer of Care Note  Patient: Scott David  Procedure(s) Performed: RADIOACTIVE SEED IMPLANT/BRACHYTHERAPY IMPLANT/ CYSTOSCOPY (N/A Prostate) SPACE OAR INSTILLATION (N/A Rectum) CYSTOSCOPY FLEXIBLE (N/A Bladder)  Patient Location: PACU  Anesthesia Type:General  Level of Consciousness: awake  Airway & Oxygen Therapy: Patient Spontanous Breathing and Patient connected to face mask oxygen  Post-op Assessment: Report given to RN and Post -op Vital signs reviewed and stable  Post vital signs: Reviewed and stable  Last Vitals:  Vitals Value Taken Time  BP 161/85 11/13/19 1437  Temp    Pulse 86 11/13/19 1439  Resp 24 11/13/19 1439  SpO2 90 % 11/13/19 1439  Vitals shown include unvalidated device data.  Last Pain:  Vitals:   11/13/19 1049  TempSrc: Oral  PainSc: 0-No pain         Complications: No complications documented.

## 2019-11-16 ENCOUNTER — Encounter (HOSPITAL_BASED_OUTPATIENT_CLINIC_OR_DEPARTMENT_OTHER): Payer: Self-pay | Admitting: Urology

## 2019-11-20 ENCOUNTER — Telehealth: Payer: Self-pay | Admitting: *Deleted

## 2019-11-20 NOTE — Telephone Encounter (Signed)
Returned patient's phone call, spoke with patient 

## 2019-11-20 NOTE — Progress Notes (Signed)
  Radiation Oncology         [phone] ________________________________  Name: Scott David MRN: [id number]  Date: 11/20/2019  DOB: Dec 23, 1960       Prostate Seed Implant  QQ:[id number], Scott Gash, MD  No ref. provider found  DIAGNOSIS:  Oncology History  Prostate cancer ([place])  12/25/2018 Cancer Staging   Staging form: Prostate, AJCC 8th Edition - Clinical stage from 12/25/2018: Stage IIB (cT1c, cN0, cM0, PSA: 6, Grade Group: 2) - Signed by Freeman Caldron, PA-C on 11/20/2019   02/03/2019 Initial Diagnosis   Prostate cancer ([place])     No diagnosis found.  PROCEDURE: Insertion of radioactive I-125 seeds into the prostate gland.  RADIATION DOSE: 145 Gy, definitive therapy.  TECHNIQUE: Scott David was brought to the operating room with the urologist. He was placed in the dorsolithotomy position. He was catheterized and a rectal tube was inserted. The perineum was shaved, prepped and draped. The ultrasound probe was then introduced into the rectum to see the prostate gland.  TREATMENT DEVICE: A needle grid was attached to the ultrasound probe stand and anchor needles were placed.  3D PLANNING: The prostate was imaged in 3D using a sagittal sweep of the prostate probe. These images were transferred to the planning computer. There, the prostate, urethra and rectum were defined on each axial reconstructed image. Then, the software created an optimized 3D plan and a few seed positions were adjusted. The quality of the plan was reviewed using [facility] information for the target and the following two organs at risk:  Urethra and Rectum.  Then the accepted plan was printed and handed off to the radiation therapist.  Under my supervision, the custom loading of the seeds and spacers was carried out and loaded into sealed vicryl sleeves.  These pre-loaded needles were then placed into the needle holder.Marland Kitchen  PROSTATE VOLUME STUDY:  Using transrectal ultrasound the volume of the prostate was verified to be 42.8  cc.  SPECIAL TREATMENT PROCEDURE/SUPERVISION AND HANDLING: The pre-loaded needles were then delivered under sagittal guidance. A total of 19 needles were used to deposit 70 seeds in the prostate gland. The individual seed activity was 0.484 mCi.  SpaceOAR:  Yes, Not VUE - shellfish allergies  COMPLEX SIMULATION: At the end of the procedure, an anterior radiograph of the pelvis was obtained to document seed positioning and count. Cystoscopy was performed to check the urethra and bladder.  MICRODOSIMETRY: At the end of the procedure, the patient was emitting 0.058 mR/hr at 1 meter. Accordingly, he was considered safe for hospital discharge.  PLAN: The patient will return to the radiation oncology clinic for post implant CT dosimetry in three weeks.   ________________________________  Sheral Apley Tammi Klippel, M.D.

## 2019-11-24 ENCOUNTER — Other Ambulatory Visit (HOSPITAL_COMMUNITY): Payer: BC Managed Care – PPO

## 2019-12-02 ENCOUNTER — Telehealth: Payer: Self-pay | Admitting: *Deleted

## 2019-12-02 NOTE — Telephone Encounter (Signed)
Returned patient's phone call, lvm for a return call 

## 2019-12-08 ENCOUNTER — Telehealth: Payer: Self-pay | Admitting: *Deleted

## 2019-12-08 NOTE — Telephone Encounter (Signed)
CALLED PATIENT TO INFORM THAT POST SEED APPTS. AND MRI HAVE BEEN MOVED TO 12-24-19 DUE TO THE PATIENT BEING ON VACATION, SPOKE WITH PATIENT AND HE VERIFIED UNDERSTANDING THESE APPTS.

## 2019-12-09 ENCOUNTER — Ambulatory Visit: Payer: BC Managed Care – PPO | Admitting: Radiation Oncology

## 2019-12-09 ENCOUNTER — Ambulatory Visit: Admit: 2019-12-09 | Payer: BC Managed Care – PPO | Admitting: Urology

## 2019-12-09 ENCOUNTER — Ambulatory Visit (HOSPITAL_COMMUNITY): Payer: BC Managed Care – PPO

## 2019-12-21 NOTE — Progress Notes (Signed)
  Radiation Oncology         [phone] ________________________________  Name: Scott David MRN: [id number]  Date: 12/24/2019  DOB: 09-14-1960  COMPLEX SIMULATION NOTE  NARRATIVE:  The patient was brought to the [facility] today following prostate seed implantation approximately one month ago.  Identity was confirmed.  All relevant records and images related to the planned course of therapy were reviewed.  Then, the patient was set-up supine.  CT images were obtained.  The CT images were loaded into the planning software.  Then the prostate and rectum were contoured.  Treatment planning then occurred.  The implanted iodine 125 seeds were identified by the physics staff for projection of radiation distribution  I have requested : 3D Simulation  I have requested a DVH of the following structures: Prostate and rectum.    ________________________________  Sheral Apley Tammi Klippel, M.D.

## 2019-12-23 ENCOUNTER — Telehealth: Payer: Self-pay | Admitting: *Deleted

## 2019-12-23 NOTE — Telephone Encounter (Signed)
Called patient to remind of post seed appts. and MRI For 12-24-19, spoke with patient and he is aware of these appts.

## 2019-12-23 NOTE — Progress Notes (Signed)
Radiation Oncology         [phone] ________________________________  Name: MOODY ROBBEN MRN: [id number]  Date: 12/24/2019  DOB: Apr 19, 1961  Post-Seed Follow-Up Visit Note  CC: Claretta Fraise, MD  Claretta Fraise, MD  Diagnosis:   59 y.o. gentleman with Stage T1c adenocarcinoma of the prostate with Gleason score of 3+4, and PSA of 5.96.    ICD-10-CM   1. Prostate cancer ([place])  C61     Interval Since Last Radiation:  6 weeks 11/13/2019:  Insertion of radioactive I-125 seeds into the prostate gland; 145 Gy, definitive therapy with placement of SpaceOAR gel.  Narrative:  The patient returns today for routine follow-up.  He is complaining of increased urinary frequency and urinary hesitation symptoms. He filled out a questionnaire regarding urinary function today providing and overall IPSS score of 30 characterizing his symptoms as severe with frequency, urgency, weak stream, hesitancy, intermittency and burning at the start of the stream.  He specifically denies gross hematuria, fever, chills or night sweats.  His pre-implant score was 1. He is taking Oxybutynin as prescribed and this does help with the frequency and urgency. He denies any abdominal pain, N/V, diarrhea or constipation but does report that his BMs are on the loose side.  ALLERGIES:  is allergic to fish allergy, iodine, shellfish allergy, and propofol.  Meds: Current Outpatient Medications  Medication Sig Dispense Refill  . acetaminophen (TYLENOL) 500 MG tablet Take 500 mg by mouth every 6 (six) hours as needed (pain).    . Ascorbic Acid (VITAMIN C PO) Take 1 tablet by mouth daily. (Patient not taking: Reported on 11/10/2019)    . b complex vitamins tablet Take 1 tablet by mouth daily. (Patient not taking: Reported on 11/10/2019)    . Cholecalciferol (VITAMIN D3 PO) Take 1 tablet by mouth daily. (Patient not taking: Reported on 11/10/2019)    . Cyanocobalamin (VITAMIN B-12 PO) Take 1 tablet by mouth daily. (Patient not  taking: Reported on 11/10/2019)    . diphenhydrAMINE (BENADRYL) 25 MG tablet Take 50-100 mg by mouth once as needed (allergic reactions).    . EPINEPHrine 0.3 mg/0.3 mL IJ SOAJ injection Inject 0.3 mLs (0.3 mg total) into the muscle as needed for anaphylaxis. 2 each 5  . ibuprofen (ADVIL) 200 MG tablet Take 800 mg by mouth every 6 (six) hours as needed (pain).    . Omega-3 Fatty Acids (FISH OIL PO) Take 2 capsules by mouth daily. (Patient not taking: Reported on 11/10/2019)    . OVER THE COUNTER MEDICATION Apply 1 application topically See admin instructions. CBD oil - rub onto feet daily at bedtime (Patient not taking: Reported on 11/10/2019)    . OVER THE COUNTER MEDICATION Apply 1 application topically See admin instructions. Deep Relief Essential Oil - apply topically to feet every morning     . OVER THE COUNTER MEDICATION Take 1 tablet by mouth daily. BLM (bones, ligaments, muscles) from [facility] (Patient not taking: Reported on 11/10/2019)    . oxybutynin (DITROPAN) 5 MG tablet Take 1 tablet (5 mg total) by mouth every 8 (eight) hours as needed for bladder spasms. 30 tablet 1  . VITAMIN E BLEND PO Take by mouth. (Patient not taking: Reported on 11/10/2019)     No current facility-administered medications for this visit.    Physical Findings: In general this is a well appearing African-American male in no acute distress. He's alert and oriented x4 and appropriate throughout the examination. Cardiopulmonary assessment is negative for acute  distress and he exhibits normal effort.   Lab Findings: Lab Results  Component Value Date   WBC 6.3 11/11/2019   HGB 12.6 (L) 11/11/2019   HCT 41.1 11/11/2019   MCV 89.2 11/11/2019   PLT 318 11/11/2019    Radiographic Findings:  Patient underwent CT imaging in our clinic for post implant dosimetry. The CT will be reviewed by Dr. Tammi Klippel to confirm there is an adequate distribution of radioactive seeds throughout the prostate gland and ensure that  there are no seeds in or near the rectum. His scheduled for prostate MRI at 12 noon today following our visit and those images will be fused with his CT images for further evaluation. We suspect the final radiation plan and dosimetry will show appropriate coverage of the prostate gland. He understands that we will call and inform him of any unexpected findings on further review of his imaging and dosimetry.  Impression/Plan: 59 y.o. gentleman with Stage T1c adenocarcinoma of the prostate with Gleason score of 3+4, and PSA of 5.96. The patient is recovering from the effects of radiation. His urinary symptoms should gradually improve over the next 4-6 months. We talked about this today. He is encouraged by his improvement already and is otherwise pleased with his outcome. We also talked about long-term follow-up for prostate cancer following seed implant. He understands that ongoing PSA determinations and digital rectal exams will help perform surveillance to rule out disease recurrence. He does not currently have a scheduled follow up appointment with Dr. Lovena Neighbours to his knowledge. He understands what to expect with his PSA measures. Patient was also educated today about some of the long-term effects from radiation including a small risk for rectal bleeding and possibly erectile dysfunction. We talked about some of the general management approaches to these potential complications. I am sending a prescription for Flomax to see if this will help manage the BOO sxs that he is experiencing secondary to the radiation effects. However, I did encourage the patient to contact our office or return at any point if he has questions or concerns related to his previous radiation and prostate cancer.  Today, a comprehensive survivorship care plan and treatment summary was reviewed with the patient today detailing his prostate cancer diagnosis, treatment course, potential late/long-term effects of treatment, appropriate  follow-up care with recommendations for the future, and patient education resources.  A copy of this summary, along with a letter will be sent to the patient's primary care provider via fax after today's visit.  2. Cancer screening:  Due to Mr. Rafter's history and his age, he should receive screening for skin cancers and colon cancer.  The information and recommendations are listed on the patient's comprehensive care plan/treatment summary and were reviewed in detail with the patient.     3. Health maintenance and wellness promotion: Mr. Sessums was encouraged to consume 5-7 servings of fruits and vegetables per day. He was provided a copy of the "Nutrition Rainbow" handout, as well as the handout "Take Control of Your Health and [place]" from the [place].  He was also encouraged to engage in moderate to vigorous exercise for 30 minutes per day most days of the week. Information was provided regarding the [facility] fitness program, which is designed for cancer survivors to help them become more physically fit after cancer treatments. We discussed that a healthy BMI is 18.5-24.9 and that maintaining a healthy weight reduces risk of cancer recurrences.  He was instructed to limit  his alcohol consumption and continue to abstain from tobacco use.  Lastly, he was encouraged to use sunscreen and wear protective clothing when in the sun.     4. Support services/counseling: It is not uncommon for this period of the patient's cancer care trajectory to be one of many emotions and stressors.  Mr. Stellmach was encouraged to take advantage of our many support services programs, support groups, and/or counseling in coping with his new life as a cancer survivor after completing anti-cancer treatment.  He was offered support today through active listening and expressive supportive counseling.  He was given information regarding our available services and encouraged to contact me with any  questions or for help enrolling in any of our support group/programs.      Nicholos Johns, PA-C

## 2019-12-24 ENCOUNTER — Other Ambulatory Visit: Payer: Self-pay | Admitting: Urology

## 2019-12-24 ENCOUNTER — Ambulatory Visit (HOSPITAL_COMMUNITY)
Admission: RE | Admit: 2019-12-24 | Discharge: 2019-12-24 | Disposition: A | Discharge status: Home / Self Care | Payer: BC Managed Care – PPO | Source: Ambulatory Visit | Attending: Urology | Admitting: Urology

## 2019-12-24 ENCOUNTER — Encounter: Payer: Self-pay | Admitting: Urology

## 2019-12-24 ENCOUNTER — Ambulatory Visit
Admission: RE | Admit: 2019-12-24 | Discharge: 2019-12-24 | Disposition: A | Discharge status: Home / Self Care | Payer: BC Managed Care – PPO | Source: Ambulatory Visit | Attending: Radiation Oncology | Admitting: Radiation Oncology

## 2019-12-24 ENCOUNTER — Other Ambulatory Visit: Payer: Self-pay

## 2019-12-24 ENCOUNTER — Encounter: Payer: Self-pay | Admitting: Medical Oncology

## 2019-12-24 ENCOUNTER — Ambulatory Visit
Admission: RE | Admit: 2019-12-24 | Discharge: 2019-12-24 | Disposition: A | Discharge status: Home / Self Care | Payer: BC Managed Care – PPO | Source: Ambulatory Visit | Attending: Urology | Admitting: Urology

## 2019-12-24 VITALS — BP 130/76 | HR 60 | Temp 99.1°F | Resp 18 | Wt 296.6 lb

## 2019-12-24 DIAGNOSIS — C61 Malignant neoplasm of prostate: Secondary | ICD-10-CM | POA: Diagnosis not present

## 2019-12-24 MED ORDER — SILODOSIN 8 MG PO CAPS: 8.0000 mg | ORAL_CAPSULE | Freq: Every day | ORAL | 2 refills | Status: AC

## 2019-12-24 NOTE — Progress Notes (Signed)
Patient here today for a post seed appointment. Patient states Nocturia 4 times per night. Patient denies having any leakage of urine. Patient states having hesitancy. Patient states always emptying his bladder completely. Patient states that he always has frequency. Patient states intermittency more than half the time. Patient states urgency. Patient states urine stream is weak. Patient states having to strain during urination.

## 2019-12-25 ENCOUNTER — Telehealth: Payer: Self-pay | Admitting: *Deleted

## 2019-12-25 NOTE — Telephone Encounter (Signed)
Called patient to inform that he needs to call the pharmacy to see if that they have his script, I informed him of the info that Ashlyn gave it, patient verified understanding this

## 2020-01-15 ENCOUNTER — Ambulatory Visit
Admission: RE | Admit: 2020-01-15 | Discharge: 2020-01-15 | Disposition: A | Discharge status: Home / Self Care | Payer: BC Managed Care – PPO | Source: Ambulatory Visit | Attending: Radiation Oncology | Admitting: Radiation Oncology

## 2020-01-15 ENCOUNTER — Encounter: Payer: Self-pay | Admitting: Radiation Oncology

## 2020-01-15 DIAGNOSIS — C61 Malignant neoplasm of prostate: Secondary | ICD-10-CM | POA: Insufficient documentation

## 2020-02-22 NOTE — Progress Notes (Signed)
  Radiation Oncology         [phone] ________________________________  Name: NOMAR BROAD MRN: [id number]  Date: 01/15/2020  DOB: 1960/07/24  3D Planning Note   Prostate Brachytherapy Post-Implant Dosimetry  Diagnosis: 59 y.o. gentleman with Stage T1c adenocarcinoma of the prostate with Gleason score of 3+4, and PSA of 5.96  Narrative: On a previous date, Euclid G Farooqui returned following prostate seed implantation for post implant planning. He underwent CT scan complex simulation to delineate the three-dimensional structures of the pelvis and demonstrate the radiation distribution.  Since that time, the seed localization, and complex isodose planning with dose volume histograms have now been completed.  Results:   Prostate Coverage - The dose of radiation delivered to the 90% or more of the prostate gland (D90) was 96.5% of the prescription dose. This exceeds our goal of greater than 90%. Rectal Sparing - The volume of rectal tissue receiving the prescription dose or higher was 0.0 cc. This falls under our thresholds tolerance of 1.0 cc.  Impression: The prostate seed implant appears to show adequate target coverage and appropriate rectal sparing.  Plan:  The patient will continue to follow with urology for ongoing PSA determinations. I would anticipate a high likelihood for local tumor control with minimal risk for rectal morbidity.  ________________________________  Sheral Apley Tammi Klippel, M.D.

## 2020-03-14 ENCOUNTER — Other Ambulatory Visit: Payer: Self-pay | Admitting: Urology

## 2021-03-11 IMAGING — DX RIGHT KNEE - 1-2 VIEW
2 series · 2 of 2 positions shown · non-contrast
Comparison: None.

CLINICAL DATA: Pain after jumping from trailer

EXAM:
RIGHT KNEE - 1-2 VIEW; AP LEFT KNEE

[knee ap]
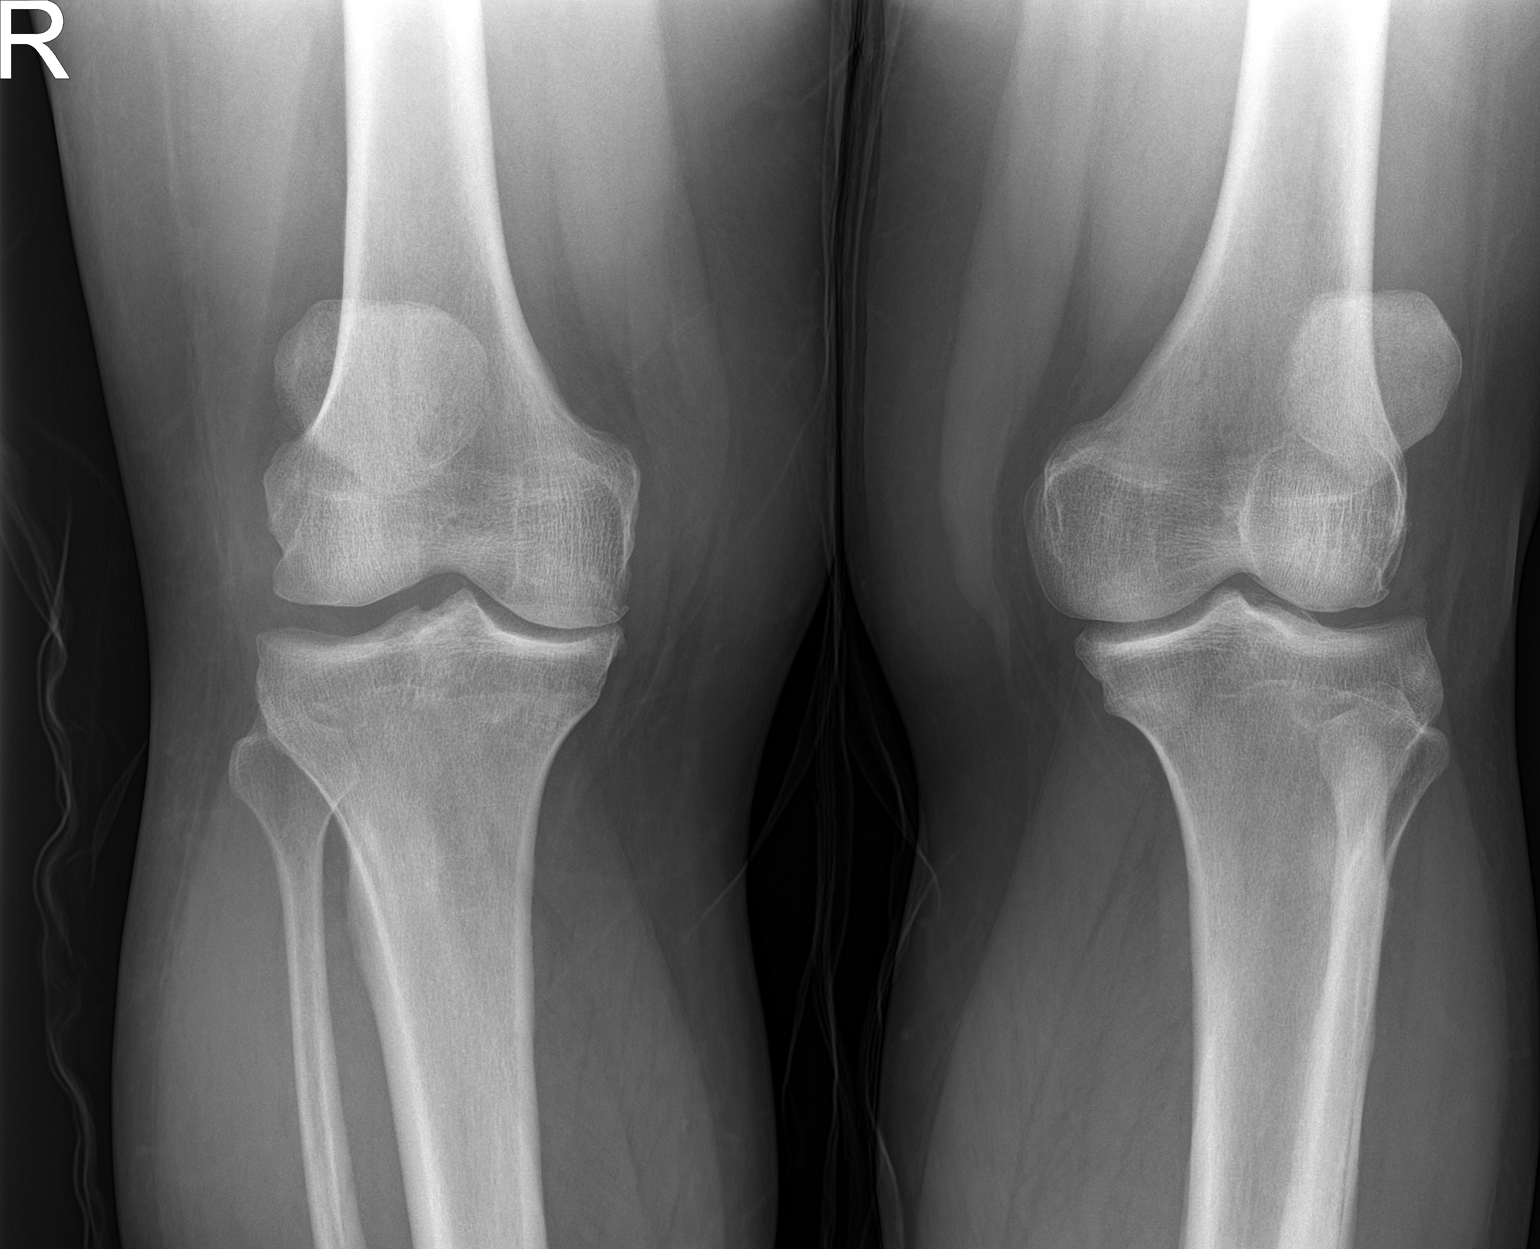

[knee lat]
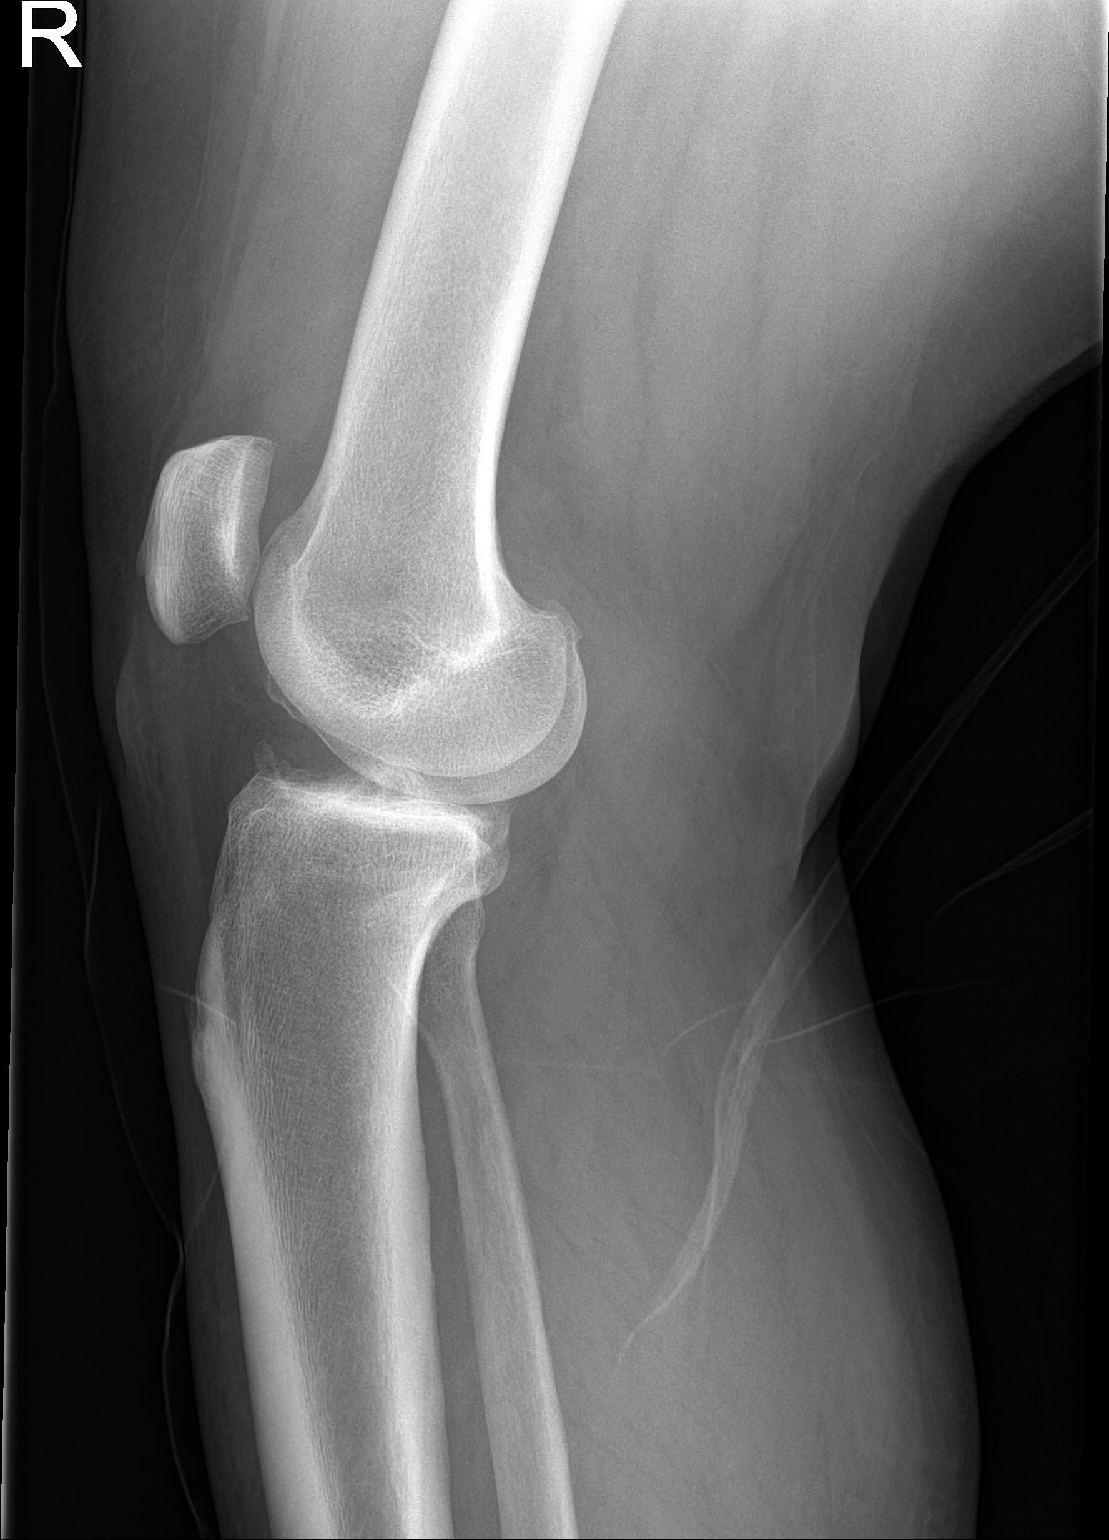

[2 of 2 positions shown; findings below may reference images not displayed]

FINDINGS: Right knee: Frontal and lateral views were obtained. There is no
appreciable fracture or dislocation. No joint effusion. There is
moderately severe narrowing medially. Other joint spaces appear
unremarkable. No erosive change.

AP left knee: There is apparent lateral patellar subluxation. No
fracture evident. There is mild narrowing medially.
IMPRESSION: Right knee: Moderately severe joint space narrowing medially. No
fracture or dislocation. No joint effusion.

AP left knee: Lateral patellar subluxation. No fracture evident.
Moderate narrowing medially.

## 2021-07-23 IMAGING — MR MR FOOT*R* WO/W CM
8 of 9 series · 33 of 40 positions shown · IV contrast (multihance)
Comparison: None.

CLINICAL DATA: Pain between the first and second toes.

EXAM:
MRI OF THE RIGHT FOREFOOT WITHOUT AND WITH CONTRAST
TECHNIQUE: Multiplanar, multisequence MR imaging of the right forefoot was
performed before and after the administration of intravenous
contrast.
CONTRAST:  20mL MULTIHANCE GADOBENATE DIMEGLUMINE 529 MG/ML IV SOLN

[Series 5: T2 fat-sat · coronal · 3.0mm · 0.23mm/px · 5 of 53 slices shown (1 of 2)]
[im 1/53]
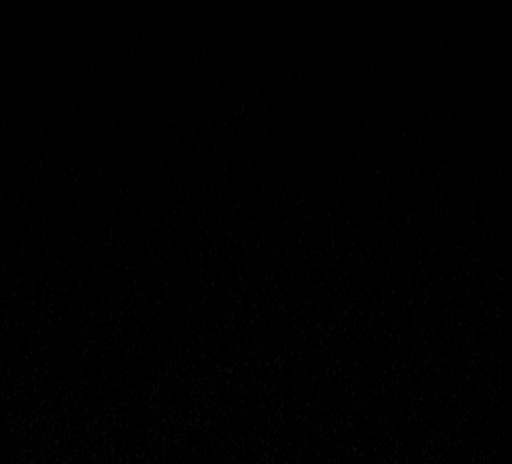
[im 14/53]
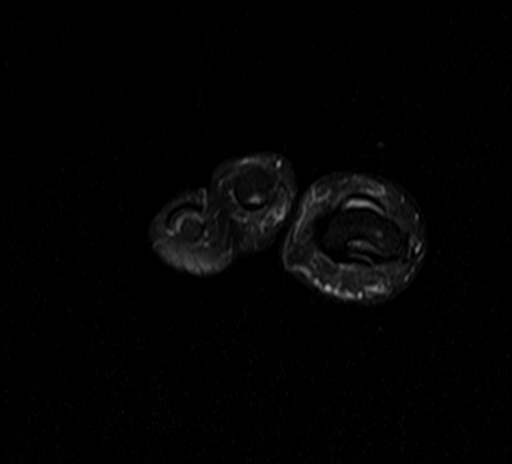
[im 27/53]
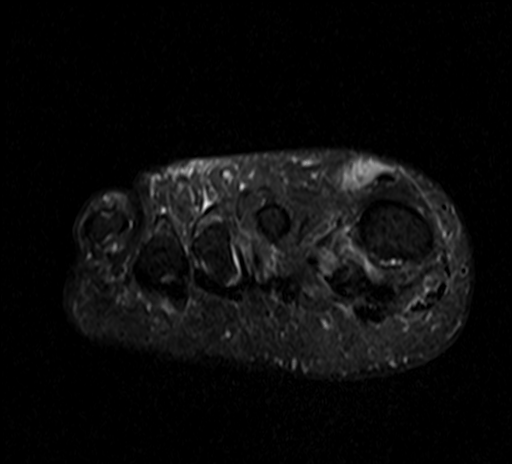
[im 40/53]
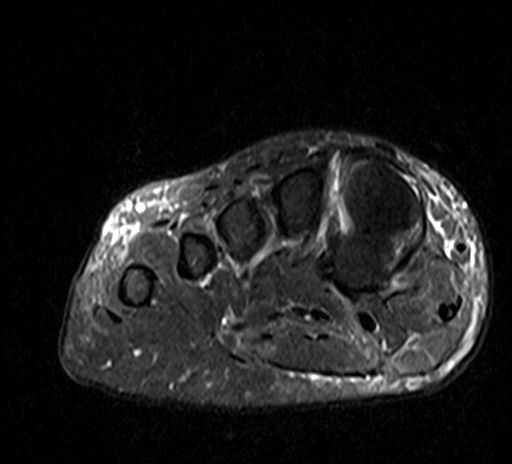
[im 53/53]
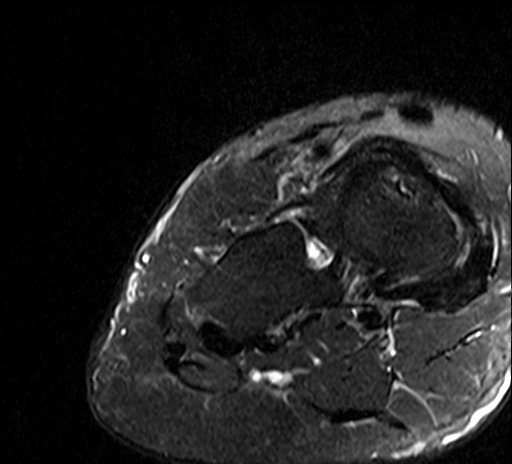

[Series 6: T1 · coronal · 3.0mm · 0.47mm/px · 6 of 50 slices shown (1 of 2)]
[im 1/50]
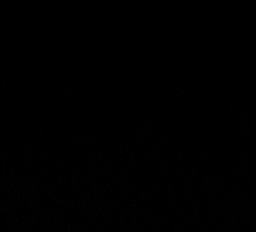
[im 10/50]
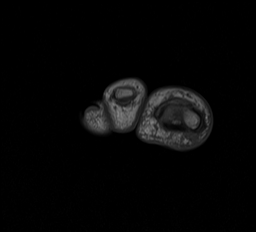
[im 20/50]
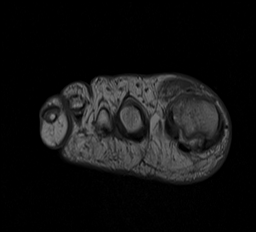
[im 30/50]
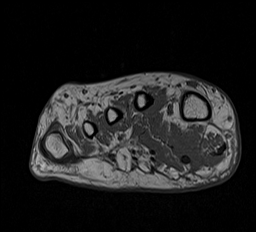
[im 40/50]
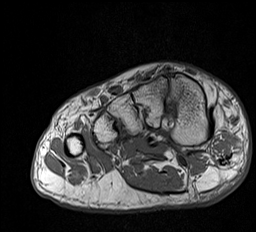
[im 50/50]
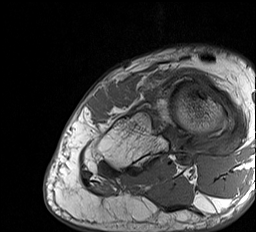

[Series 7: T1 · axial · 3.0mm · 0.35mm/px · z∈[-185,-100]mm · 3 of 26 slices shown (2 of 2)]
[im 1/26]
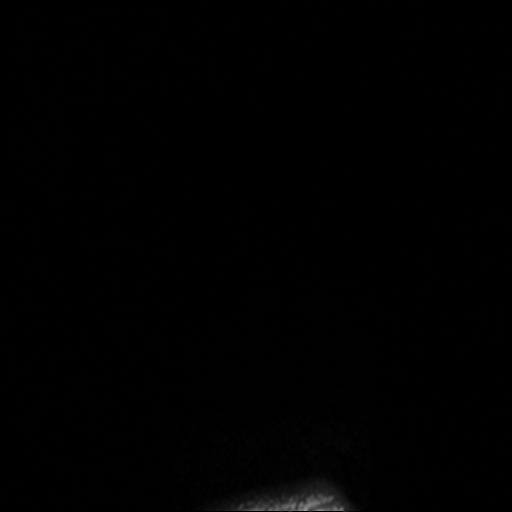
[im 13/26]
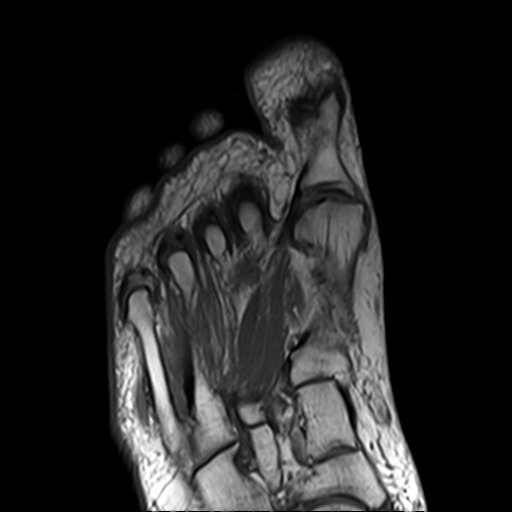
[im 26/26]
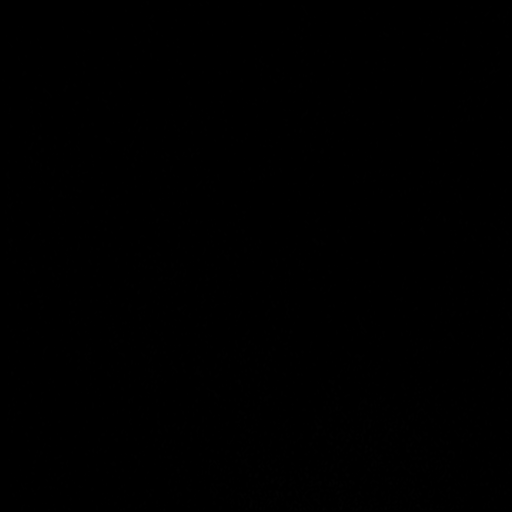

[Series 8: T1 fat-sat · coronal · non-contrast · 3.0mm · 0.47mm/px · 6 of 52 slices shown]
[im 1/52]
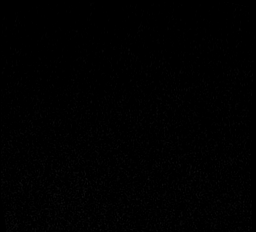
[im 11/52]
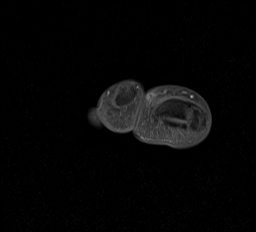
[im 21/52]
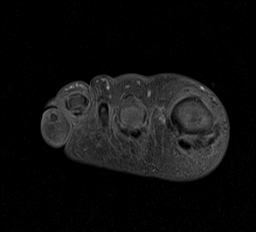
[im 31/52]
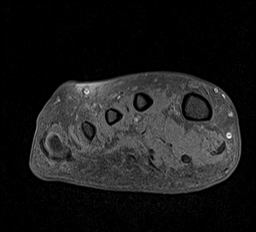
[im 41/52]
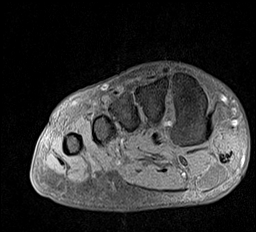
[im 52/52]
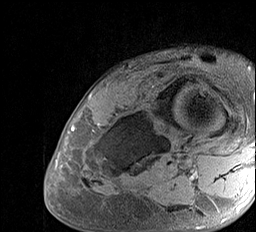

[Series 9: T2 fat-sat · axial · 3.0mm · 0.35mm/px · z∈[-185,-100]mm · 3 of 26 slices shown (2 of 2)]
[im 1/26]
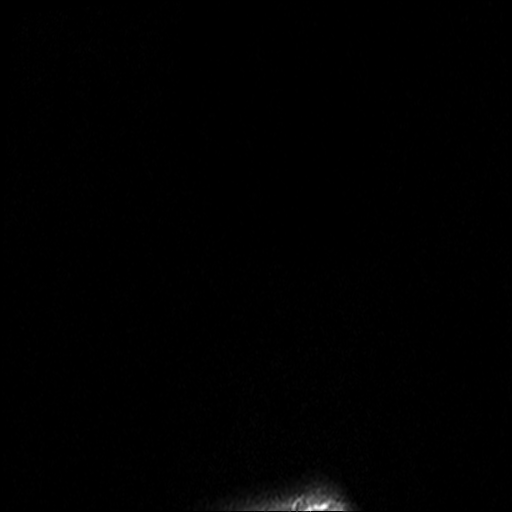
[im 13/26]
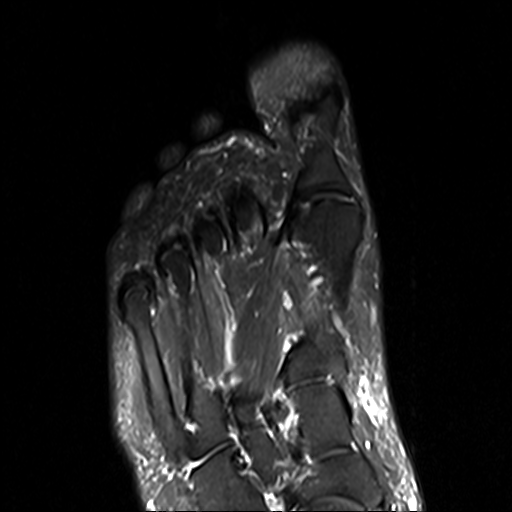
[im 26/26]
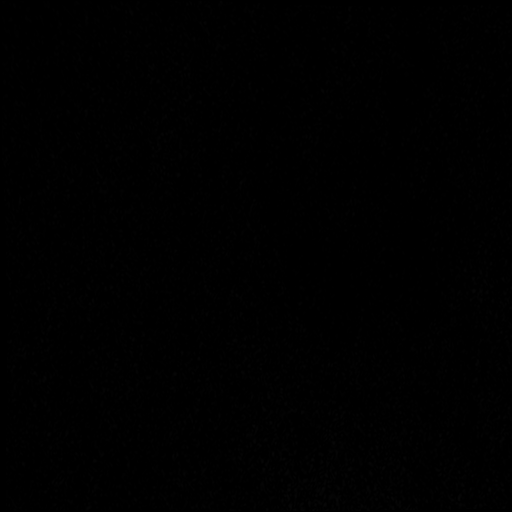

[Series 11: T1 fat-sat post-contrast · coronal · 3.0mm · 0.47mm/px · 6 of 51 slices shown (1 of 3)]
[im 1/51]
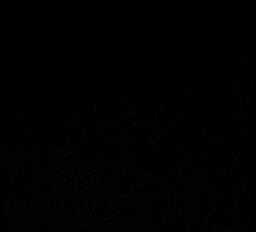
[im 11/51]
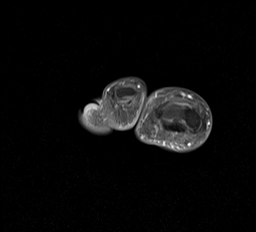
[im 21/51]
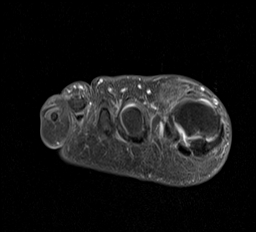
[im 31/51]
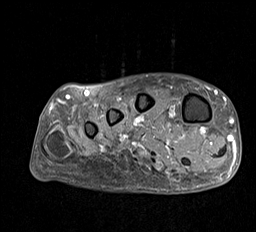
[im 41/51]
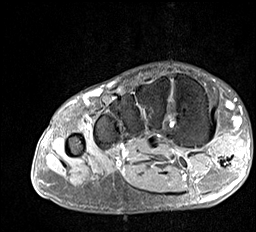
[im 51/51]
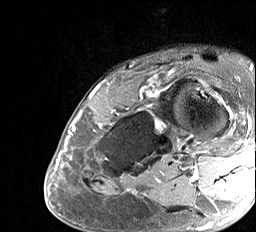

[Series 12: T1 fat-sat post-contrast · axial · 3.0mm · 0.35mm/px · z∈[-185,-100]mm · 3 of 26 slices shown (2 of 3)]
[im 1/26]
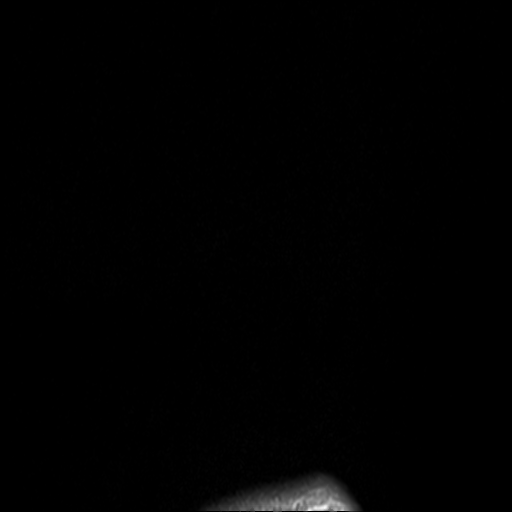
[im 13/26]
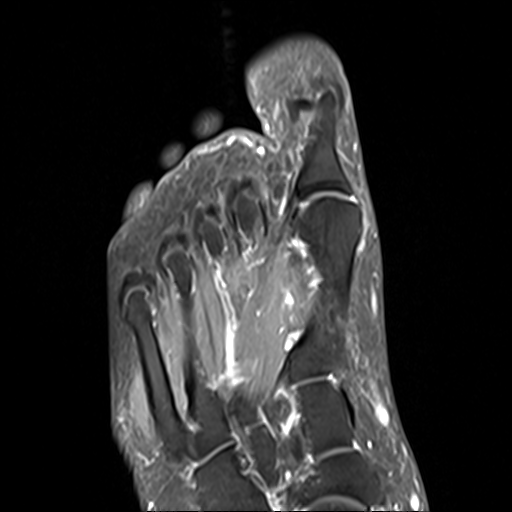
[im 26/26]
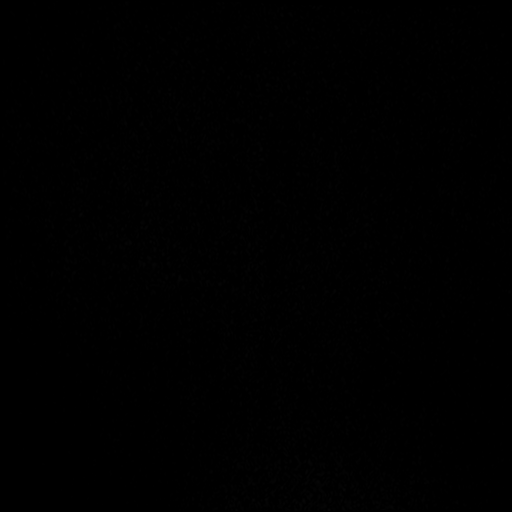

[Series 13: T1 fat-sat post-contrast · sagittal · 3.0mm · 0.35mm/px · 1 of 34 slices shown (3 of 3)]
[im 1/34]
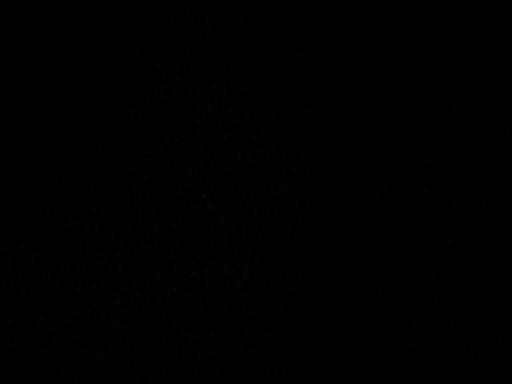

[33 of 40 positions shown; findings below may reference images not displayed]

FINDINGS: Bones/Joint/Cartilage

No marrow signal abnormality. No fracture or dislocation. Normal
alignment. No joint effusion. No periosteal reaction or bone
destruction. Mild osteoarthritis of the first MTP joint and first IP
joint. Mild osteoarthritis of the first TMT joint.

Ligaments

Collateral ligaments are intact.  Lisfranc ligament is intact.

Muscles and Tendons
Flexor, peroneal and extensor compartment tendons are intact.
Muscles are normal.

Soft tissue
No fluid collection or hematoma. 1.5 x 1.4 x 2 cm well-circumscribed
complex soft tissue mass along the dorsal aspect of the medial
forefoot located along the lateral aspect of the first metatarsal
head. Interspersed areas of T1 hyperintensity with signal dropout on
fat saturated images and large areas of mildly enhancing soft tissue
components. No other soft tissue mass.
IMPRESSION: 1. 1.5 x 1.4 x 2 cm well-circumscribed soft tissue mass along the
medial aspect of the forefoot concerning for a low-grade
liposarcoma. Tissue diagnosis recommended.

## 2023-03-25 ENCOUNTER — Institutional Professional Consult (permissible substitution): Payer: Self-pay | Admitting: Diagnostic Neuroimaging

## 2023-03-26 ENCOUNTER — Telehealth: Payer: Self-pay | Admitting: Family Medicine

## 2023-04-18 ENCOUNTER — Telehealth: Payer: Self-pay | Admitting: Family Medicine

## 2023-04-18 NOTE — Telephone Encounter (Signed)
I am not taking new patinets at this time.

## 2023-04-18 NOTE — Telephone Encounter (Signed)
Appt scheduled with Dois Davenport 06/04/2023

## 2023-04-18 NOTE — Telephone Encounter (Unsigned)
Copied from CRM [id number]. Topic: Appointments - Appointment Info/Confirmation >> Apr 17, 2023  4:51 PM Tiffany H wrote: Patient's wife Raynelle Fanning is calling to ask if Dr. Darlyn Read would be willing to continue with patient's yearly labs/blood work. Patient's wife advised that she was told that Dr. Darlyn Read needed to give approval before another appointment could be scheduled. Please assist and follow up with patient's wife.   CB: [phone]

## 2023-05-03 ENCOUNTER — Telehealth: Payer: Self-pay

## 2023-05-03 NOTE — Telephone Encounter (Signed)
Copied from CRM [id number]. Topic: Clinical - Request for Lab/Test Order >> May 03, 2023  4:07 PM Scott David wrote: Reason for CRM: Reason for CRM: Patient would like to come in for labs prior to their visit on 06/04/2023 - Can we have the provider enter the orders and could someone call and provide them with an appointment (preferably the week of Christmas)  Patient informed because he is a new patient and will be establishing care with [place] we recommend he wait until the day of his appointment to have lab work so that Dois Davenport can order what she feels is best that day.

## 2023-05-03 NOTE — Telephone Encounter (Unsigned)
Copied from CRM [id number]. Topic: Clinical - Request for Lab/Test Order >> May 03, 2023  4:07 PM Scott David wrote: Reason for CRM: Reason for CRM: Patient would like to come in for labs prior to their visit on 06/04/2023 - Can we have the provider enter the orders and could someone call and provide them with an appointment (preferably the week of Christmas)

## 2023-06-04 ENCOUNTER — Ambulatory Visit: Payer: Self-pay | Admitting: Nurse Practitioner
